# Patient Record
Sex: Female | Born: 1985 | Race: Black or African American | Hispanic: No | Marital: Single | Smoking: Former smoker
Health system: Southern US, Community
[De-identification: ages and names within clinical notes are randomized; demographics above are authoritative.]

## PROBLEM LIST (undated history)

## (undated) ENCOUNTER — Inpatient Hospital Stay (HOSPITAL_COMMUNITY): Payer: Self-pay

## (undated) DIAGNOSIS — F419 Anxiety disorder, unspecified: Secondary | ICD-10-CM

## (undated) DIAGNOSIS — N750 Cyst of Bartholin's gland: Secondary | ICD-10-CM

## (undated) HISTORY — PX: CHOLECYSTECTOMY: SHX55

## (undated) HISTORY — PX: BREAST SURGERY: SHX581

---

## 2015-10-12 ENCOUNTER — Encounter (HOSPITAL_COMMUNITY): Payer: Self-pay | Admitting: Emergency Medicine

## 2015-10-12 ENCOUNTER — Emergency Department (HOSPITAL_COMMUNITY)
Admission: EM | Admit: 2015-10-12 | Discharge: 2015-10-12 | Disposition: A | Discharge status: Home / Self Care | Payer: Medicaid Other | Attending: Emergency Medicine | Admitting: Emergency Medicine

## 2015-10-12 DIAGNOSIS — J029 Acute pharyngitis, unspecified: Secondary | ICD-10-CM | POA: Diagnosis not present

## 2015-10-12 DIAGNOSIS — N760 Acute vaginitis: Secondary | ICD-10-CM | POA: Diagnosis not present

## 2015-10-12 DIAGNOSIS — N72 Inflammatory disease of cervix uteri: Secondary | ICD-10-CM | POA: Diagnosis not present

## 2015-10-12 DIAGNOSIS — F17200 Nicotine dependence, unspecified, uncomplicated: Secondary | ICD-10-CM | POA: Insufficient documentation

## 2015-10-12 LAB — WET PREP, GENITAL
Sperm: NONE SEEN
Trich, Wet Prep: NONE SEEN
Yeast Wet Prep HPF POC: NONE SEEN

## 2015-10-12 LAB — RAPID STREP SCREEN (MED CTR MEBANE ONLY): STREPTOCOCCUS, GROUP A SCREEN (DIRECT): NEGATIVE

## 2015-10-12 MED ORDER — AZITHROMYCIN 250 MG PO TABS
1000.0000 mg | ORAL_TABLET | Freq: Once | ORAL | Status: AC
  Administered 2015-10-12: 1000 mg via ORAL
  Filled 2015-10-12: qty 4

## 2015-10-12 MED ORDER — CEFTRIAXONE SODIUM 250 MG IJ SOLR
250.0000 mg | Freq: Once | INTRAMUSCULAR | Status: AC
  Administered 2015-10-12: 250 mg via INTRAMUSCULAR
  Filled 2015-10-12: qty 250

## 2015-10-12 MED ORDER — OXYCODONE-ACETAMINOPHEN 5-325 MG PO TABS
1.0000 | ORAL_TABLET | Freq: Once | ORAL | Status: AC
  Administered 2015-10-12: 1 via ORAL
  Filled 2015-10-12: qty 1

## 2015-10-12 MED ORDER — METRONIDAZOLE 500 MG PO TABS: 500.0000 mg | ORAL_TABLET | Freq: Two times a day (BID) | ORAL | Status: DC

## 2015-10-12 MED ORDER — LIDOCAINE HCL 1 % IJ SOLN
INTRAMUSCULAR | Status: AC
  Administered 2015-10-12: 2.1 mL
  Filled 2015-10-12: qty 20

## 2015-10-12 NOTE — Progress Notes (Signed)
Medicaid [place] access response hx indicates [facility] [address] [place], [place] [id number] [phone]

## 2015-10-12 NOTE — ED Notes (Signed)
Pt reports body aches and sore throat since Saturday. Sore throat unrelieved by multisymptom cold reliever. Pt also reports she has been having creamy vaginal discharge that began this am. Pt recently had sex with boy friend.

## 2015-10-12 NOTE — ED Provider Notes (Signed)
CSN: [id number]     Arrival date & time 10/12/15  4098 History   First MD Initiated Contact with Patient 10/12/15 [phone]     Chief Complaint  Patient presents with  . Sore Throat  . Generalized Body Aches  . Vaginal Discharge     (Consider location/radiation/quality/duration/timing/severity/associated sxs/prior Treatment) HPI   Ann Allen is a 30 y.o. female who presents for evaluation of sore throat for 3 days, with fever, chills, achiness, nausea and decreased appetite. She also noticed a vaginal discharge, white, thick, today. She's been sexually active recently. No dysuria or change in bowel habits. There are no other no modifying factors.   History reviewed. No pertinent past medical history. History reviewed. No pertinent past surgical history. History reviewed. No pertinent family history. Social History  Substance Use Topics  . Smoking status: Current Every Day Smoker  . Smokeless tobacco: None  . Alcohol Use: Yes   OB History    No data available     Review of Systems  All other systems reviewed and are negative.     Allergies  Review of patient's allergies indicates no known allergies.  Home Medications   Prior to Admission medications   Medication Sig Start Date End Date Taking? Authorizing Provider  Pseudoephed-APAP-Guaifenesin (TYLENOL SINUS SEVERE CONGEST) 30-325-200 MG TABS Take 2 tablets by mouth every 6 (six) hours as needed (for cold symptoms).   Yes Historical Provider, MD   BP 114/75 mmHg  Pulse 111  Temp(Src) 99.3 F (37.4 C) (Oral)  Resp 16  SpO2 100% Physical Exam  Constitutional: She is oriented to person, place, and time. She appears well-developed and well-nourished.  HENT:  Head: Normocephalic and atraumatic.  Right Ear: External ear normal.  Left Ear: External ear normal.  Bilateral tonsillar hypertrophy with exudate. No peritonsillar swelling.  Eyes: Conjunctivae and EOM are normal. Pupils are equal, round, and reactive to light.   Neck: Normal range of motion and phonation normal. Neck supple.  Tender upper cervical nodes, bilaterally.  Cardiovascular: Normal rate, regular rhythm and normal heart sounds.   Pulmonary/Chest: Effort normal and breath sounds normal. She exhibits no bony tenderness.  Abdominal: Soft. There is no tenderness.  Genitourinary:  Normal external female genitalia. Small amount of yellow opaque vaginal discharge. No cervical discharge or cervicitis. On bimanual examination there is mild diffuse right-sided tenderness without palpable mass or organomegaly.  Musculoskeletal: Normal range of motion.  Neurological: She is alert and oriented to person, place, and time. No cranial nerve deficit or sensory deficit. She exhibits normal muscle tone. Coordination normal.  Skin: Skin is warm, dry and intact.  Psychiatric: She has a normal mood and affect. Her behavior is normal. Judgment and thought content normal.  Nursing note and vitals reviewed.   ED Course  Procedures (including critical care time)  Medications  oxyCODONE-acetaminophen (PERCOCET/ROXICET) 5-325 MG per tablet 1 tablet (1 tablet Oral Given 10/12/15 1204)  cefTRIAXone (ROCEPHIN) injection 250 mg (250 mg Intramuscular Given 10/12/15 1204)  azithromycin (ZITHROMAX) tablet 1,000 mg (1,000 mg Oral Given 10/12/15 1203)  lidocaine (XYLOCAINE) 1 % (with pres) injection (2.1 mLs  Given 10/12/15 1204)    Patient Vitals for the past 24 hrs:  BP Temp Temp src Pulse Resp SpO2  10/12/15 1339 112/76 mmHg - - 99 18 100 %  10/12/15 1134 108/66 mmHg 99.6 F (37.6 C) Oral 112 18 100 %  10/12/15 0955 114/75 mmHg 99.3 F (37.4 C) Oral 111 16 100 %    At discharge- Reevaluation  with update and discussion. After initial assessment and treatment, an updated evaluation reveals comfortable ambulating easily. Findings discussed with patient, all questions answered. Lemond Griffee L    Labs Review Labs Reviewed  RAPID STREP SCREEN (NOT AT [facility])  CULTURE,  GROUP A STREP ([facility])  WET PREP, GENITAL  RPR  HIV ANTIBODY (ROUTINE TESTING)  GC/CHLAMYDIA PROBE AMP ([place]) NOT AT [facility]    Imaging Review No results found. I have personally reviewed and evaluated these images and lab results as part of my medical decision-making.   EKG Interpretation None      MDM   Final diagnoses:  Cervicitis  Pharyngitis  Vaginitis    Combination of pharyngitis, strep negative, and vaginitis, sexually active patient. Mild right adnexal tenderness, without mass. Clinical concern for gonococcal infection. Treated empirically. Vaginitis will be covered with Flagyl.  Nursing Notes Reviewed/ Care Coordinated Applicable Imaging Reviewed Interpretation of Laboratory Data incorporated into ED treatment  The patient appears reasonably screened and/or stabilized for discharge and I doubt any other medical condition or other [facility] requiring further screening, evaluation, or treatment in the ED at this time prior to discharge.  Plan: Home Medications- Flagyl; Home Treatments- rest; return here if the recommended treatment, does not improve the symptoms; Recommended follow up- PCP 1 week     Mancel BaleElliott Dicky Boer, MD 10/12/15 1413

## 2015-10-12 NOTE — ED Notes (Signed)
Bed: WA01 Expected date:  Expected time:  Means of arrival:  Comments: EMS- 20s, sore throat/body aches

## 2015-10-12 NOTE — Congregational Nurse Program (Signed)
Congregational Nurse Program Note  Date of Encounter: 10/12/2015  Past Medical History: No past medical history on file.  Encounter Details:     CNP Questionnaire - 10/12/15 1905    Patient Demographics   Is this a new or existing patient? New   Patient is considered a/an Not Applicable   Race African-American/Black   Patient Assistance   Location of Patient Assistance Not Applicable   Patient's financial/insurance status Medicaid   Uninsured Patient No   Patient referred to apply for the following financial assistance Not Applicable   Food insecurities addressed Not Applicable   Transportation assistance No   Assistance securing medications No   Educational health offerings Acute disease   Encounter Details   Primary purpose of visit Acute Illness/Condition Visit;Post ED/Hospitalization Visit;Spiritual Care/Support Visit;Education/Health Concerns   Was an Emergency Department visit averted? Not Applicable   Does patient have a medical provider? Yes   Patient referred to Not Applicable   Was a mental health screening completed? (GAINS tool) No   Does patient have dental issues? No   Does patient have vision issues? No   Does your patient have an abnormal blood pressure today? No   Since previous encounter, have you referred patient for abnormal blood pressure that resulted in a new diagnosis or medication change? No   Does your patient have an abnormal blood glucose today? No   Since previous encounter, have you referred patient for abnormal blood glucose that resulted in a new diagnosis or medication change? No   Was there a life-saving intervention made? No    Client in to see nurse past her  ED  Visit . Crying  States her  Throat is really sore ,aching  ,has a fever ,hasnt eaten in 2-3 days . Nurse  Counseled regarding sore throat , given a list of things to do  Like gargle with  Warm water and salt , ,throat lozenges , drink  Plenty of  Fluids , suck on ice pops . Client  states she had  not  eaten  Lately  And encourage to drink plenty off fluids , rest and give herself  Past  ED treatment 24 hours to feel better if not  Come by to see the nurse tomorrow. States culture for strep was negative.

## 2015-10-12 NOTE — Discharge Instructions (Signed)
Sexually Transmitted Disease °A sexually transmitted disease (STD) is a disease or infection that may be passed (transmitted) from person to person, usually during sexual activity. This may happen by way of saliva, semen, blood, vaginal mucus, or urine. Common STDs include: °· Gonorrhea. °· Chlamydia. °· Syphilis. °· HIV and AIDS. °· Genital herpes. °· Hepatitis B and C. °· Trichomonas. °· Human papillomavirus (HPV). °· Pubic lice. °· Scabies. °· Mites. °· Bacterial vaginosis. °WHAT ARE CAUSES OF STDs? °An STD may be caused by bacteria, a virus, or parasites. STDs are often transmitted during sexual activity if one person is infected. However, they may also be transmitted through nonsexual means. STDs may be transmitted after:  °· Sexual intercourse with an infected person. °· Sharing sex toys with an infected person. °· Sharing needles with an infected person or using unclean piercing or tattoo needles. °· Having intimate contact with the genitals, mouth, or rectal areas of an infected person. °· Exposure to infected fluids during birth. °WHAT ARE THE SIGNS AND SYMPTOMS OF STDs? °Different STDs have different symptoms. Some people may not have any symptoms. If symptoms are present, they may include: °· Painful or bloody urination. °· Pain in the pelvis, abdomen, vagina, anus, throat, or eyes. °· A skin rash, itching, or irritation. °· Growths, ulcerations, blisters, or sores in the genital and anal areas. °· Abnormal vaginal discharge with or without bad odor. °· Penile discharge in men. °· Fever. °· Pain or bleeding during sexual intercourse. °· Swollen glands in the groin area. °· Yellow skin and eyes (jaundice). This is seen with hepatitis. °· Swollen testicles. °· Infertility. °· Sores and blisters in the mouth. °HOW ARE STDs DIAGNOSED? °To make a diagnosis, your health care provider may: °· Take a medical history. °· Perform a physical exam. °· Take a sample of any discharge to examine. °· Swab the throat,  cervix, opening to the penis, rectum, or vagina for testing. °· Test a sample of your first morning urine. °· Perform blood tests. °· Perform a Pap test, if this applies. °· Perform a colposcopy. °· Perform a laparoscopy. °HOW ARE STDs TREATED? °Treatment depends on the STD. Some STDs may be treated but not cured. °· Chlamydia, gonorrhea, trichomonas, and syphilis can be cured with antibiotic medicine. °· Genital herpes, hepatitis, and HIV can be treated, but not cured, with prescribed medicines. The medicines lessen symptoms. °· Genital warts from HPV can be treated with medicine or by freezing, burning (electrocautery), or surgery. Warts may come back. °· HPV cannot be cured with medicine or surgery. However, abnormal areas may be removed from the cervix, vagina, or vulva. °· If your diagnosis is confirmed, your recent sexual partners need treatment. This is true even if they are symptom-free or have a negative culture or evaluation. They should not have sex until their health care providers say it is okay. °· Your health care provider may test you for infection again 3 months after treatment. °HOW CAN I REDUCE MY RISK OF GETTING AN STD? °Take these steps to reduce your risk of getting an STD: °· Use latex condoms, dental dams, and water-soluble lubricants during sexual activity. Do not use petroleum jelly or oils. °· Avoid having multiple sex partners. °· Do not have sex with someone who has other sex partners °· Do not have sex with anyone you do not know or who is at high risk for an STD. °· Avoid risky sex practices that can break your skin. °· Do not have sex   if you have open sores on your mouth or skin.  Avoid drinking too much alcohol or taking illegal drugs. Alcohol and drugs can affect your judgment and put you in a vulnerable position.  Avoid engaging in oral and anal sex acts.  Get vaccinated for HPV and hepatitis. If you have not received these vaccines in the past, talk to your health care  provider about whether one or both might be right for you.  If you are at risk of being infected with HIV, it is recommended that you take a prescription medicine daily to prevent HIV infection. This is called pre-exposure prophylaxis (PrEP). You are considered at risk if:  You are a man who has sex with other men (MSM).  You are a heterosexual man or woman and are sexually active with more than one partner.  You take drugs by injection.  You are sexually active with a partner who has HIV.  Talk with your health care provider about whether you are at high risk of being infected with HIV. If you choose to begin PrEP, you should first be tested for HIV. You should then be tested every 3 months for as long as you are taking PrEP. WHAT SHOULD I DO IF I THINK I HAVE AN STD?  See your health care provider.  Tell your sexual partner(s). They should be tested and treated for any STDs.  Do not have sex until your health care provider says it is okay. WHEN SHOULD I GET IMMEDIATE MEDICAL CARE? Contact your health care provider right away if:   You have severe abdominal pain.  You are a man and notice swelling or pain in your testicles.  You are a woman and notice swelling or pain in your vagina.   This information is not intended to replace advice given to you by your health care provider. Make sure you discuss any questions you have with your health care provider.   Document Released: 07/22/2002 Document Revised: 05/22/2014 Document Reviewed: 11/19/2012 Elsevier Interactive Patient Education 2016 Else[employer] Inc[employer]  Pharyngitis Pharyngitis is a sore throat (pharynx). There is redness, pain, and swelling of your throat. HOME CARE   Drink enough fluids to keep your pee (urine) clear or pale yellow.  Only take medicine as told by your doctor.  You may get sick again if you do not take medicine as told. Finish your medicines, even if you start to feel better.  Do not take  aspirin.  Rest.  Rinse your mouth (gargle) with salt water ( tsp of salt per 1 qt of water) every 1-2 hours. This will help the pain.  If you are not at risk for choking, you can suck on hard candy or sore throat lozenges. GET HELP IF:  You have large, tender lumps on your neck.  You have a rash.  You cough up green, yellow-[place]le, or bloody spit. GET HELP RIGHT AWAY IF:   You have a stiff neck.  You drool or cannot swallow liquids.  You throw up (vomit) or are not able to keep medicine or liquids down.  You have very bad pain that does not go away with medicine.  You have problems breathing (not from a stuffy nose). MAKE SURE YOU:   Understand these instructions.  Will watch your condition.  Will get help right away if you are not doing well or get worse.   This information is not intended to replace advice given to you by your health care provider. Make sure you  discuss any questions you have with your health care provider.   Document Released: 10/18/2007 Document Revised: 02/19/2013 Document Reviewed: 01/06/2013 Elsevier Interactive Patient Education 2016 Elsevier Inc.  Bacterial Vaginosis Bacterial vaginosis is a vaginal infection that occurs when the normal balance of bacteria in the vagina is disrupted. It results from an overgrowth of certain bacteria. This is the most common vaginal infection in women of childbearing age. Treatment is important to prevent complications, especially in pregnant women, as it can cause a premature delivery. CAUSES  Bacterial vaginosis is caused by an increase in harmful bacteria that are normally present in smaller amounts in the vagina. Several different kinds of bacteria can cause bacterial vaginosis. However, the reason that the condition develops is not fully understood. RISK FACTORS Certain activities or behaviors can put you at an increased risk of developing bacterial vaginosis, including:  Having a new sex partner or multiple  sex partners.  Douching.  Using an intrauterine device (IUD) for contraception. Women do not get bacterial vaginosis from toilet seats, bedding, swimming pools, or contact with objects around them. SIGNS AND SYMPTOMS  Some women with bacterial vaginosis have no signs or symptoms. Common symptoms include:  Grey vaginal discharge.  A fishlike odor with discharge, especially after sexual intercourse.  Itching or burning of the vagina and vulva.  Burning or pain with urination. DIAGNOSIS  Your health care provider will take a medical history and examine the vagina for signs of bacterial vaginosis. A sample of vaginal fluid may be taken. Your health care provider will look at this sample under a microscope to check for bacteria and abnormal cells. A vaginal pH test may also be done.  TREATMENT  Bacterial vaginosis may be treated with antibiotic medicines. These may be given in the form of a pill or a vaginal cream. A second round of antibiotics may be prescribed if the condition comes back after treatment. Because bacterial vaginosis increases your risk for sexually transmitted diseases, getting treated can help reduce your risk for chlamydia, gonorrhea, HIV, and herpes. HOME CARE INSTRUCTIONS   Only take over-the-counter or prescription medicines as directed by your health care provider.  If antibiotic medicine was prescribed, take it as directed. Make sure you finish it even if you start to feel better.  Tell all sexual partners that you have a vaginal infection. They should see their health care provider and be treated if they have problems, such as a mild rash or itching.  During treatment, it is important that you follow these instructions:  Avoid sexual activity or use condoms correctly.  Do not douche.  Avoid alcohol as directed by your health care provider.  Avoid breastfeeding as directed by your health care provider. SEEK MEDICAL CARE IF:   Your symptoms are not improving  after 3 days of treatment.  You have increased discharge or pain.  You have a fever. MAKE SURE YOU:   Understand these instructions.  Will watch your condition.  Will get help right away if you are not doing well or get worse. FOR MORE INFORMATION  Centers for Disease Control and Prevention, Division of STD Prevention: [employer] American Sexual Health Association (ASHA): www.ashastd.org    This information is not intended to replace advice given to you by your health care provider. Make sure you discuss any questions you have with your health care provider.   Document Released: 05/01/2005 Document Revised: 05/22/2014 Document Reviewed: 12/11/2012 Elsevier Interactive Patient Education [employer]

## 2015-10-13 ENCOUNTER — Emergency Department (HOSPITAL_COMMUNITY)
Admission: EM | Admit: 2015-10-13 | Discharge: 2015-10-13 | Disposition: A | Discharge status: Home / Self Care | Payer: Medicaid Other | Attending: Emergency Medicine | Admitting: Emergency Medicine

## 2015-10-13 ENCOUNTER — Encounter (HOSPITAL_COMMUNITY): Payer: Self-pay | Admitting: Emergency Medicine

## 2015-10-13 DIAGNOSIS — J028 Acute pharyngitis due to other specified organisms: Secondary | ICD-10-CM

## 2015-10-13 DIAGNOSIS — F17200 Nicotine dependence, unspecified, uncomplicated: Secondary | ICD-10-CM | POA: Diagnosis not present

## 2015-10-13 DIAGNOSIS — J029 Acute pharyngitis, unspecified: Secondary | ICD-10-CM | POA: Diagnosis present

## 2015-10-13 DIAGNOSIS — J069 Acute upper respiratory infection, unspecified: Secondary | ICD-10-CM | POA: Insufficient documentation

## 2015-10-13 DIAGNOSIS — Z79899 Other long term (current) drug therapy: Secondary | ICD-10-CM | POA: Diagnosis not present

## 2015-10-13 DIAGNOSIS — B9789 Other viral agents as the cause of diseases classified elsewhere: Secondary | ICD-10-CM

## 2015-10-13 DIAGNOSIS — R509 Fever, unspecified: Secondary | ICD-10-CM

## 2015-10-13 LAB — GC/CHLAMYDIA PROBE AMP (~~LOC~~) NOT AT ARMC
CHLAMYDIA, DNA PROBE: NEGATIVE
Neisseria Gonorrhea: NEGATIVE

## 2015-10-13 LAB — HIV ANTIBODY (ROUTINE TESTING W REFLEX): HIV Screen 4th Generation wRfx: NONREACTIVE

## 2015-10-13 LAB — RPR: RPR Ser Ql: NONREACTIVE

## 2015-10-13 MED ORDER — CETIRIZINE HCL 10 MG PO TABS: 10.0000 mg | ORAL_TABLET | Freq: Every day | ORAL | Status: DC

## 2015-10-13 MED ORDER — ONDANSETRON HCL 4 MG/2ML IJ SOLN
4.0000 mg | INTRAMUSCULAR | Status: AC
  Administered 2015-10-13: 4 mg via INTRAVENOUS
  Filled 2015-10-13: qty 2

## 2015-10-13 MED ORDER — NAPROXEN 500 MG PO TABS: 500.0000 mg | ORAL_TABLET | Freq: Two times a day (BID) | ORAL | Status: DC

## 2015-10-13 MED ORDER — ONDANSETRON 4 MG PO TBDP: 4.0000 mg | ORAL_TABLET | Freq: Three times a day (TID) | ORAL | Status: DC | PRN

## 2015-10-13 MED ORDER — FLUTICASONE PROPIONATE 50 MCG/ACT NA SUSP
2.0000 | Freq: Every day | NASAL | Status: DC
Start: 2015-10-13 — End: 2016-07-05

## 2015-10-13 MED ORDER — ACETAMINOPHEN 325 MG PO TABS
650.0000 mg | ORAL_TABLET | Freq: Once | ORAL | Status: AC
  Administered 2015-10-13: 650 mg via ORAL
  Filled 2015-10-13: qty 2

## 2015-10-13 MED ORDER — DEXAMETHASONE SODIUM PHOSPHATE 10 MG/ML IJ SOLN
10.0000 mg | Freq: Once | INTRAMUSCULAR | Status: AC
  Administered 2015-10-13: 10 mg via INTRAMUSCULAR
  Filled 2015-10-13: qty 1

## 2015-10-13 MED ORDER — SODIUM CHLORIDE 0.9 % IV BOLUS (SEPSIS)
1000.0000 mL | Freq: Once | INTRAVENOUS | Status: AC
  Administered 2015-10-13: 1000 mL via INTRAVENOUS

## 2015-10-13 NOTE — ED Notes (Signed)
Patient presents with multiple complaints. C/o generalized body aches, subjective fever, nausea, sore throat. Seen yesterday for same, diagnosed with pharyngitis and treated for STD. Patient states no relief of symptoms and has not been able to take PO antibiotics.

## 2015-10-13 NOTE — ED Notes (Signed)
Patient was alert, oriented and stable upon discharge. RN went over AVS and patient had no further questions.  

## 2015-10-13 NOTE — Discharge Instructions (Signed)
Pharyngitis °Pharyngitis is redness, pain, and swelling (inflammation) of your pharynx.  °CAUSES  °Pharyngitis is usually caused by infection. Most of the time, these infections are from viruses (viral) and are part of a cold. However, sometimes pharyngitis is caused by bacteria (bacterial). Pharyngitis can also be caused by allergies. Viral pharyngitis may be spread from person to person by coughing, sneezing, and personal items or utensils (cups, forks, spoons, toothbrushes). Bacterial pharyngitis may be spread from person to person by more intimate contact, such as kissing.  °SIGNS AND SYMPTOMS  °Symptoms of pharyngitis include:   °· Sore throat.   °· Tiredness (fatigue).   °· Low-grade fever.   °· Headache. °· Joint pain and muscle aches. °· Skin rashes. °· Swollen lymph nodes. °· Plaque-like film on throat or tonsils (often seen with bacterial pharyngitis). °DIAGNOSIS  °Your health care provider will ask you questions about your illness and your symptoms. Your medical history, along with a physical exam, is often all that is needed to diagnose pharyngitis. Sometimes, a rapid strep test is done. Other lab tests may also be done, depending on the suspected cause.  °TREATMENT  °Viral pharyngitis will usually get better in 3-4 days without the use of medicine. Bacterial pharyngitis is treated with medicines that kill germs (antibiotics).  °HOME CARE INSTRUCTIONS  °· Drink enough water and fluids to keep your urine clear or pale yellow.   °· Only take over-the-counter or prescription medicines as directed by your health care provider:   °· If you are prescribed antibiotics, make sure you finish them even if you start to feel better.   °· Do not take aspirin.   °· Get lots of rest.   °· Gargle with 8 oz of salt water (½ tsp of salt per 1 qt of water) as often as every 1-2 hours to soothe your throat.   °· Throat lozenges (if you are not at risk for choking) or sprays may be used to soothe your throat. °SEEK MEDICAL  CARE IF:  °· You have large, tender lumps in your neck. °· You have a rash. °· You cough up green, yellow-[place], or bloody spit. °SEEK IMMEDIATE MEDICAL CARE IF:  °· Your neck becomes stiff. °· You drool or are unable to swallow liquids. °· You vomit or are unable to keep medicines or liquids down. °· You have severe pain that does not go away with the use of recommended medicines. °· You have trouble breathing (not caused by a stuffy nose). °MAKE SURE YOU:  °· Understand these instructions. °· Will watch your condition. °· Will get help right away if you are not doing well or get worse. °  °This information is not intended to replace advice given to you by your health care provider. Make sure you discuss any questions you have with your health care provider. °  °Document Released: 05/01/2005 Document Revised: 02/19/2013 Document Reviewed: 01/06/2013 °Elsevier Interactive Patient Education ©2016 [employer] °Upper Respiratory Infection, Adult °Most upper respiratory infections (URIs) are a viral infection of the air passages leading to the lungs. A URI affects the nose, throat, and upper air passages. The most common type of URI is nasopharyngitis and is typically referred to as "the common cold." °URIs run their course and usually go away on their own. Most of the time, a URI does not require medical attention, but sometimes a bacterial infection in the upper airways can follow a viral infection. This is called a secondary infection. Sinus and middle ear infections are common types of secondary upper respiratory infections. °Bacterial pneumonia   can also complicate a URI. A URI can worsen asthma and chronic obstructive pulmonary disease (COPD). Sometimes, these complications can require emergency medical care and may be life threatening.  °CAUSES °Almost all URIs are caused by viruses. A virus is a type of germ and can spread from one person to another.  °RISKS FACTORS °You may be at risk for a URI if:  °· You  smoke.   °· You have chronic heart or lung disease. °· You have a weakened defense (immune) system.   °· You are very young or very old.   °· You have nasal allergies or asthma. °· You work in crowded or poorly ventilated areas. °· You work in health care facilities or schools. °SIGNS AND SYMPTOMS  °Symptoms typically develop 2-3 days after you come in contact with a cold virus. Most viral URIs last 7-10 days. However, viral URIs from the influenza virus (flu virus) can last 14-18 days and are typically more severe. Symptoms may include:  °· Runny or stuffy (congested) nose.   °· Sneezing.   °· Cough.   °· Sore throat.   °· Headache.   °· Fatigue.   °· Fever.   °· Loss of appetite.   °· Pain in your forehead, behind your eyes, and over your cheekbones (sinus pain). °· Muscle aches.   °DIAGNOSIS  °Your health care provider may diagnose a URI by: °· Physical exam. °· Tests to check that your symptoms are not due to another condition such as: °¨ Strep throat. °¨ Sinusitis. °¨ Pneumonia. °¨ Asthma. °TREATMENT  °A URI goes away on its own with time. It cannot be cured with medicines, but medicines may be prescribed or recommended to relieve symptoms. Medicines may help: °· Reduce your fever. °· Reduce your cough. °· Relieve nasal congestion. °HOME CARE INSTRUCTIONS  °· Take medicines only as directed by your health care provider.   °· Gargle warm saltwater or take cough drops to comfort your throat as directed by your health care provider. °· Use a warm mist humidifier or inhale steam from a shower to increase air moisture. This may make it easier to breathe. °· Drink enough fluid to keep your urine clear or pale yellow.   °· Eat soups and other clear broths and maintain good nutrition.   °· Rest as needed.   °· Return to work when your temperature has returned to normal or as your health care provider advises. You may need to stay home longer to avoid infecting others. You can also use a face mask and careful hand  washing to prevent spread of the virus. °· Increase the usage of your inhaler if you have asthma.   °· Do not use any tobacco products, including cigarettes, chewing tobacco, or electronic cigarettes. If you need help quitting, ask your health care provider. °PREVENTION  °The best way to protect yourself from getting a cold is to practice good hygiene.  °· Avoid oral or hand contact with people with cold symptoms.   °· Wash your hands often if contact occurs.   °There is no clear evidence that vitamin C, vitamin E, echinacea, or exercise reduces the chance of developing a cold. However, it is always recommended to get plenty of rest, exercise, and practice good nutrition.  °SEEK MEDICAL CARE IF:  °· You are getting worse rather than better.   °· Your symptoms are not controlled by medicine.   °· You have chills. °· You have worsening shortness of breath. °· You have [place] or red mucus. °· You have yellow or [place] nasal discharge. °· You have pain in your face, especially   when you bend forward. °· You have a fever. °· You have swollen neck glands. °· You have pain while swallowing. °· You have white areas in the back of your throat. °SEEK IMMEDIATE MEDICAL CARE IF:  °· You have severe or persistent: °¨ Headache. °¨ Ear pain. °¨ Sinus pain. °¨ Chest pain. °· You have chronic lung disease and any of the following: °¨ Wheezing. °¨ Prolonged cough. °¨ Coughing up blood. °¨ A change in your usual mucus. °· You have a stiff neck. °· You have changes in your: °¨ Vision. °¨ Hearing. °¨ Thinking. °¨ Mood. °MAKE SURE YOU:  °· Understand these instructions. °· Will watch your condition. °· Will get help right away if you are not doing well or get worse. °  °This information is not intended to replace advice given to you by your health care provider. Make sure you discuss any questions you have with your health care provider. °  °Document Released: 10/25/2000 Document Revised: 09/15/2014 Document Reviewed: 08/06/2013 °Elsevier  Interactive Patient Education ©2016 Else[employer] Inc. ° °

## 2015-10-13 NOTE — ED Provider Notes (Signed)
CSN: [id number]     Arrival date & time 10/13/15  1533 History   First MD Initiated Contact with Patient 10/13/15 1629     Chief Complaint  Patient presents with  . Generalized Body Aches  . Nausea  . Sore Throat    Ann Allen is a 30 y.o. female who presents to the ED complaining of continued sore throat with body aches, subjective fever, and nasal congestion. She reports she is not feeling better after being seen in the emergency department yesterday. She reports she has not been able to take her antibiotics because her throat feels like knives. Patient was seen yesterday in the emergency department and had a negative rapid strep test. She was treated for gonorrhea, chlamydia and bacterial vaginosis with Flagyl. Patient reports she has not been wanting to drink liquids because her throat hurts. She has taken nothing for treatment of her symptoms today. She denies cough, shortness of breath, abdominal pain, vomiting, diarrhea, rashes.   Patient is a 30 y.o. female presenting with pharyngitis. The history is provided by the patient. No language interpreter was used.  Sore Throat Associated symptoms include arthralgias, chills, congestion, a fever (subjective ), nausea and a sore throat. Pertinent negatives include no abdominal pain, chest pain, coughing, headaches, neck pain, rash or vomiting.    History reviewed. No pertinent past medical history. History reviewed. No pertinent past surgical history. No family history on file. Social History  Substance Use Topics  . Smoking status: Current Every Day Smoker  . Smokeless tobacco: None  . Alcohol Use: Yes   OB History    No data available     Review of Systems  Constitutional: Positive for fever (subjective ) and chills.  HENT: Positive for congestion, ear pain, postnasal drip, rhinorrhea, sinus pressure, sneezing and sore throat. Negative for drooling, ear discharge, facial swelling, mouth sores, trouble swallowing and voice change.    Eyes: Negative for visual disturbance.  Respiratory: Negative for cough, shortness of breath and wheezing.   Cardiovascular: Negative for chest pain.  Gastrointestinal: Positive for nausea. Negative for vomiting, abdominal pain and diarrhea.  Genitourinary: Negative for dysuria and difficulty urinating.  Musculoskeletal: Positive for arthralgias. Negative for back pain and neck pain.  Skin: Negative for rash.  Neurological: Negative for dizziness, syncope, light-headedness and headaches.      Allergies  Review of patient's allergies indicates no known allergies.  Home Medications   Prior to Admission medications   Medication Sig Start Date End Date Taking? Authorizing Provider  metroNIDAZOLE (FLAGYL) 500 MG tablet Take 1 tablet (500 mg total) by mouth 2 (two) times daily. One po bid x 7 days 10/12/15  Yes Mancel BaleElliott Wentz, MD  Pseudoephed-APAP-Guaifenesin (TYLENOL SINUS SEVERE CONGEST) 30-325-200 MG TABS Take 2 tablets by mouth every 6 (six) hours as needed (for cold symptoms).   Yes Historical Provider, MD  cetirizine (ZYRTEC ALLERGY) 10 MG tablet Take 1 tablet (10 mg total) by mouth daily. 10/13/15   Everlene FarrierWilliam Islam Villescas, PA-C  fluticasone (FLONASE) 50 MCG/ACT nasal spray Place 2 sprays into both nostrils daily. 10/13/15   Everlene FarrierWilliam Maryana Pittmon, PA-C  naproxen (NAPROSYN) 500 MG tablet Take 1 tablet (500 mg total) by mouth 2 (two) times daily with a meal. 10/13/15   Everlene FarrierWilliam Treshun Wold, PA-C  ondansetron (ZOFRAN ODT) 4 MG disintegrating tablet Take 1 tablet (4 mg total) by mouth every 8 (eight) hours as needed for nausea or vomiting. 10/13/15   Everlene FarrierWilliam Alura Olveda, PA-C   BP 108/72 mmHg  Pulse 103  Temp(Src) 98.6 F (37 C) (Oral)  Resp 18  SpO2 99%  LMP  (Approximate) Physical Exam  Constitutional: She appears well-developed and well-nourished. No distress.  Nontoxic appearing.  HENT:  Head: Normocephalic and atraumatic.  Right Ear: External ear normal.  Left Ear: External ear normal.  Mouth/Throat:  Oropharyngeal exudate present.  Moderate bilateral tonsillar hypertrophy with exudates. Uvula is midline without edema. No peritonsillar abscess. No trismus. No drooling. Tongue protrusion is normal. Speech is clear and coherent. Mild middle ear effusion noted bilaterally. Boggy nasal turbinates noted bilaterally.  Eyes: Conjunctivae are normal. Pupils are equal, round, and reactive to light. Right eye exhibits no discharge. Left eye exhibits no discharge.  Neck: Normal range of motion. Neck supple. No JVD present.  Cardiovascular: Regular rhythm, normal heart sounds and intact distal pulses.  Exam reveals no gallop and no friction rub.   No murmur heard. Heart rate is 108.  Pulmonary/Chest: Effort normal and breath sounds normal. No stridor. No respiratory distress. She has no wheezes. She has no rales.  Lungs are clear to auscultation bilaterally.  Abdominal: Soft. She exhibits no distension. There is no tenderness. There is no guarding.  Musculoskeletal: She exhibits no edema or tenderness.  Lymphadenopathy:    She has no cervical adenopathy.  Neurological: She is alert. Coordination normal.  Skin: Skin is warm and dry. No rash noted. She is not diaphoretic. No erythema. No pallor.  Psychiatric: She has a normal mood and affect. Her behavior is normal.  Nursing note and vitals reviewed.   ED Course  Procedures (including critical care time) Labs Review Labs Reviewed - No data to display  Imaging Review No results found. I have personally reviewed and evaluated these images and lab results as part of my medical decision-making.   EKG Interpretation None      Filed Vitals:   10/13/15 1554 10/13/15 1728 10/13/15 1800 10/13/15 1802  BP: 110/75 119/69 108/72   Pulse: 128 99 112 103  Temp: 98.6 F (37 C)     TempSrc: Oral     Resp: SpO2: 99% 98% 100% 99%     MDM   Meds given in ED:  Medications  ondansetron (ZOFRAN) injection 4 mg (not administered)   dexamethasone (DECADRON) injection 10 mg (10 mg Intramuscular Given 10/13/15 1726)  sodium chloride 0.9 % bolus 1,000 mL (0 mLs Intravenous Stopped 10/13/15 1832)  acetaminophen (TYLENOL) tablet 650 mg (650 mg Oral Given 10/13/15 1725)    New Prescriptions   CETIRIZINE (ZYRTEC ALLERGY) 10 MG TABLET    Take 1 tablet (10 mg total) by mouth daily.   FLUTICASONE (FLONASE) 50 MCG/ACT NASAL SPRAY    Place 2 sprays into both nostrils daily.   NAPROXEN (NAPROSYN) 500 MG TABLET    Take 1 tablet (500 mg total) by mouth 2 (two) times daily with a meal.   ONDANSETRON (ZOFRAN ODT) 4 MG DISINTEGRATING TABLET    Take 1 tablet (4 mg total) by mouth every 8 (eight) hours as needed for nausea or vomiting.    Final diagnoses:  URI (upper respiratory infection)  Sore throat (viral)   This is a 30 y.o. female who presents to the ED complaining of continued sore throat with body aches, subjective fever, and nasal congestion. She reports she is not feeling better after being seen in the emergency department yesterday. She reports she has not been able to take her antibiotics because her throat feels like knives. Patient was seen yesterday in  the emergency department and had a negative rapid strep test. She was treated for gonorrhea, chlamydia and bacterial vaginosis with Flagyl. Patient reports she has not been wanting to drink liquids because her throat hurts.  On exam the patient is afebrile nontoxic appearing. She is mentally tachycardic with a heart rate of 108. She does have moderate bilateral tonsillar hypertrophy with exudates. No peritonsillar abscess. No trismus. No drooling. Speech is clear and coherent.  We will provide the patient with Decadron, Tylenol and fluid bolus. At reevaluation patient has tolerated 4 cups of apple juice. She is feeling much better after medications provided in the emergency department. She feels ready for discharge.  HR is down to 92. She does not feel lightheaded or dizzy. Will  discharge with prescriptions for Zyrtec, Flonase, naproxen and Zofran. I encouraged her to push oral fluids and I discussed strict and specific return precautions. I advised the patient to follow-up with their primary care provider this week. I advised the patient to return to the emergency department with new or worsening symptoms or new concerns. The patient verbalized understanding and agreement with plan.       Everlene Farrier, PA-C 10/13/15 1836  Lorre Nick, MD 10/15/15 [phone]

## 2015-10-14 LAB — CULTURE, GROUP A STREP (THRC)

## 2015-10-19 NOTE — Congregational Nurse Program (Signed)
Congregational Nurse Program Note  Date of Encounter: 10/13/2015  Past Medical History: No past medical history on file.  Encounter Details:     CNP Questionnaire - 10/19/15 1847    Patient Demographics   Is this a new or existing patient? Existing   Patient is considered a/an Not Applicable   Race African-American/Black   Patient Assistance   Location of Patient Assistance Not Applicable   Patient's financial/insurance status Medicaid   Uninsured Patient No   Patient referred to apply for the following financial assistance Not Applicable   Food insecurities addressed Not Applicable   Transportation assistance No   Assistance securing medications No   Educational health offerings Acute disease;Medications   Encounter Details   Primary purpose of visit Acute Illness/Condition Visit;Spiritual Care/Support Visit   Was an Emergency Department visit averted? No   Does patient have a medical provider? Yes   Patient referred to Follow up with established PCP   Was a mental health screening completed? (GAINS tool) No   Does patient have dental issues? No   Does patient have vision issues? No   Does your patient have an abnormal blood pressure today? No   Since previous encounter, have you referred patient for abnormal blood pressure that resulted in a new diagnosis or medication change? No   Does your patient have an abnormal blood glucose today? No   Since previous encounter, have you referred patient for abnormal blood glucose that resulted in a new diagnosis or medication change? No   Was there a life-saving intervention made? No    Client informed  Nurse she did not feel any better today so she went back to  ED  And was given fluids and feels a lot better.

## 2015-12-24 ENCOUNTER — Emergency Department (HOSPITAL_COMMUNITY): Payer: Medicaid Other

## 2015-12-24 ENCOUNTER — Encounter (HOSPITAL_COMMUNITY): Payer: Self-pay | Admitting: Emergency Medicine

## 2015-12-24 ENCOUNTER — Emergency Department (HOSPITAL_COMMUNITY)
Admission: EM | Admit: 2015-12-24 | Discharge: 2015-12-24 | Disposition: A | Discharge status: Home / Self Care | Payer: Medicaid Other | Attending: Emergency Medicine | Admitting: Emergency Medicine

## 2015-12-24 DIAGNOSIS — Z79899 Other long term (current) drug therapy: Secondary | ICD-10-CM | POA: Diagnosis not present

## 2015-12-24 DIAGNOSIS — J069 Acute upper respiratory infection, unspecified: Secondary | ICD-10-CM | POA: Diagnosis not present

## 2015-12-24 DIAGNOSIS — F17200 Nicotine dependence, unspecified, uncomplicated: Secondary | ICD-10-CM | POA: Insufficient documentation

## 2015-12-24 DIAGNOSIS — R071 Chest pain on breathing: Secondary | ICD-10-CM | POA: Diagnosis present

## 2015-12-24 MED ORDER — PREDNISONE 20 MG PO TABS: 40.0000 mg | ORAL_TABLET | Freq: Every day | ORAL | 0 refills | Status: DC

## 2015-12-24 MED ORDER — GUAIFENESIN ER 600 MG PO TB12: 600.0000 mg | ORAL_TABLET | Freq: Two times a day (BID) | ORAL | 0 refills | Status: DC

## 2015-12-24 MED ORDER — IPRATROPIUM-ALBUTEROL 0.5-2.5 (3) MG/3ML IN SOLN
3.0000 mL | Freq: Once | RESPIRATORY_TRACT | Status: AC
  Administered 2015-12-24: 3 mL via RESPIRATORY_TRACT
  Filled 2015-12-24: qty 3

## 2015-12-24 MED ORDER — BENZONATATE 100 MG PO CAPS: 100.0000 mg | ORAL_CAPSULE | Freq: Three times a day (TID) | ORAL | 0 refills | Status: DC

## 2015-12-24 MED ORDER — IBUPROFEN 400 MG PO TABS
800.0000 mg | ORAL_TABLET | Freq: Once | ORAL | Status: AC
  Administered 2015-12-24: 800 mg via ORAL
  Filled 2015-12-24: qty 2

## 2015-12-24 MED ORDER — ALBUTEROL SULFATE HFA 108 (90 BASE) MCG/ACT IN AERS: 2.0000 | INHALATION_SPRAY | RESPIRATORY_TRACT | 0 refills | Status: DC | PRN

## 2015-12-24 NOTE — ED Notes (Signed)
Called patient to take her back.  No answer.

## 2015-12-24 NOTE — ED Provider Notes (Signed)
[facility]-EMERGENCY DEPT Provider Note   CSN: [id number] Arrival date & time: 12/24/15  1351  First Provider Contact:  First MD Initiated Contact with Patient 12/24/15 1422     By signing my name below, I, Ann Allen, attest that this documentation has been prepared under the direction and in the presence of non-physician practitioner, Cheri FowlerKayla Annet Manukyan, PA-C. Electronically Signed: Freida Busmaniana Allen, Scribe. 12/24/2015. 2:31 PM.    History   Chief Complaint Chief Complaint  Patient presents with  . Chest Pain  . Cough    The history is provided by the patient. No language interpreter was used.     HPI Comments:  Ann Allen is a 30 y.o. female who presents to the Emergency Department complaining of a productive cough x 3 days. She reports associated rhinorrhea, postussive SOB, myalgias, and central CP described as burning only with cough and deep inspiration. She has taken tylenol cold and flu with moderate relief. She denies exogenous hormone use, recent surgery/hospitalization, unilateral leg swelling, hemoptysis, hx of DVT/PE. Pt is a smoker; smokes ~ 2 black and milds a day and marijuana. No sick contacts.   History reviewed. No pertinent past medical history.  There are no active problems to display for this patient.   History reviewed. No pertinent surgical history.  OB History    No data available       Home Medications    Prior to Admission medications   Medication Sig Start Date End Date Taking? Authorizing Provider  albuterol (PROVENTIL HFA;VENTOLIN HFA) 108 (90 Base) MCG/ACT inhaler Inhale 2 puffs into the lungs every 4 (four) hours as needed for wheezing or shortness of breath. 12/24/15   Kadarrius Yanke, PA-C  benzonatate (TESSALON) 100 MG capsule Take 1 capsule (100 mg total) by mouth every 8 (eight) hours. 12/24/15   Cheri FowlerKayla Madgie Dhaliwal, PA-C  cetirizine (ZYRTEC ALLERGY) 10 MG tablet Take 1 tablet (10 mg total) by mouth daily. 10/13/15   Everlene FarrierWilliam Dansie, PA-C  fluticasone (FLONASE)  50 MCG/ACT nasal spray Place 2 sprays into both nostrils daily. 10/13/15   Everlene FarrierWilliam Dansie, PA-C  guaiFENesin (MUCINEX) 600 MG 12 hr tablet Take 1 tablet (600 mg total) by mouth 2 (two) times daily. 12/24/15   Cheri FowlerKayla Kieren Adkison, PA-C  metroNIDAZOLE (FLAGYL) 500 MG tablet Take 1 tablet (500 mg total) by mouth 2 (two) times daily. One po bid x 7 days 10/12/15   Mancel BaleElliott Wentz, MD  naproxen (NAPROSYN) 500 MG tablet Take 1 tablet (500 mg total) by mouth 2 (two) times daily with a meal. 10/13/15   Everlene FarrierWilliam Dansie, PA-C  ondansetron (ZOFRAN ODT) 4 MG disintegrating tablet Take 1 tablet (4 mg total) by mouth every 8 (eight) hours as needed for nausea or vomiting. 10/13/15   Everlene FarrierWilliam Dansie, PA-C  predniSONE (DELTASONE) 20 MG tablet Take 2 tablets (40 mg total) by mouth daily. 12/24/15   Cheri FowlerKayla Elizer Bostic, PA-C  Pseudoephed-APAP-Guaifenesin (TYLENOL SINUS SEVERE CONGEST) 30-325-200 MG TABS Take 2 tablets by mouth every 6 (six) hours as needed (for cold symptoms).    Historical Provider, MD    Family History No family history on file.  Social History Social History  Substance Use Topics  . Smoking status: Current Every Day Smoker  . Smokeless tobacco: Never Used  . Alcohol use Yes     Allergies   Review of patient's allergies indicates no known allergies.   Review of Systems Review of Systems  Constitutional: Positive for fever.  HENT: Positive for rhinorrhea. Negative for sore throat.   Respiratory: Positive  for cough and shortness of breath.   Cardiovascular: Positive for chest pain.  Musculoskeletal: Positive for myalgias.     Physical Exam Updated Vital Signs BP 118/83 (BP Location: Right Arm)   Pulse 95   Temp 100.2 F (37.9 C) (Oral)   Resp 16   LMP 12/24/2015 (Exact Date)   SpO2 96%   Physical Exam  Constitutional: She is oriented to person, place, and time. She appears well-developed and well-nourished.  Non-toxic appearance. She does not have a sickly appearance. She does not appear ill.    HENT:  Head: Normocephalic and atraumatic.  Right Ear: Tympanic membrane normal.  Left Ear: Tympanic membrane normal.  Nose: Nose normal.  Mouth/Throat: Oropharynx is clear and moist.  Eyes: Conjunctivae are normal. Pupils are equal, round, and reactive to light.  Neck: Normal range of motion. Neck supple.  Cardiovascular: Normal rate and regular rhythm.   Pulmonary/Chest: Effort normal. No accessory muscle usage or stridor. No respiratory distress. She has no wheezes. She has rhonchi. She has no rales.  Abdominal: Soft. Bowel sounds are normal. She exhibits no distension. There is no tenderness.  Musculoskeletal: Normal range of motion. She exhibits no edema.  No unilateral LE edema  Lymphadenopathy:    She has no cervical adenopathy.  Neurological: She is alert and oriented to person, place, and time.  Speech clear without dysarthria.  Skin: Skin is warm and dry.  Psychiatric: She has a normal mood and affect. Her behavior is normal.  Nursing note and vitals reviewed.    ED Treatments / Results  DIAGNOSTIC STUDIES:  Oxygen Saturation is 96% on RA, normal by my interpretation.    COORDINATION OF CARE:  2:30 PM Discussed treatment plan with pt at bedside and pt agreed to plan.  Labs (all labs ordered are listed, but only abnormal results are displayed) Labs Reviewed - No data to display  EKG  EKG Interpretation None       Radiology Dg Chest 2 View  Result Date: 12/24/2015 CLINICAL DATA:  Productive cough EXAM: CHEST  2 VIEW COMPARISON:  None. FINDINGS: The heart size and mediastinal contours are within normal limits. Both lungs are clear. The visualized skeletal structures are unremarkable. IMPRESSION: No active cardiopulmonary disease. Electronically Signed   By: Alcide Clever M.D.   On: 12/24/2015 15:14    Procedures Procedures (including critical care time)  Medications Ordered in ED Medications  ipratropium-albuterol (DUONEB) 0.5-2.5 (3) MG/3ML nebulizer  solution 3 mL (3 mLs Nebulization Given 12/24/15 1435)  ibuprofen (ADVIL,MOTRIN) tablet 800 mg (800 mg Oral Given 12/24/15 1439)     Initial Impression / Assessment and Plan / ED Course  I have reviewed the triage vital signs and the nursing notes.  Pertinent labs & imaging results that were available during my care of the patient were reviewed by me and considered in my medical decision making (see chart for details).  Clinical Course   Pt symptoms consistent with URI. Low risk PE using Well's criteria, and patient is PERC negative.  CXR negative for acute infiltrate. Pt will be discharged with symptomatic treatment.  Discussed return precautions including worsening constant chest pain, shortness of breath, or hemoptysis.  Pt is hemodynamically stable & in NAD prior to discharge.  Final Clinical Impressions(s) / ED Diagnoses   Final diagnoses:  URI (upper respiratory infection)    New Prescriptions New Prescriptions   ALBUTEROL (PROVENTIL HFA;VENTOLIN HFA) 108 (90 BASE) MCG/ACT INHALER    Inhale 2 puffs into the lungs every 4 (  four) hours as needed for wheezing or shortness of breath.   BENZONATATE (TESSALON) 100 MG CAPSULE    Take 1 capsule (100 mg total) by mouth every 8 (eight) hours.   GUAIFENESIN (MUCINEX) 600 MG 12 HR TABLET    Take 1 tablet (600 mg total) by mouth 2 (two) times daily.   PREDNISONE (DELTASONE) 20 MG TABLET    Take 2 tablets (40 mg total) by mouth daily.    I personally performed the services described in this documentation, which was scribed in my presence. The recorded information has been reviewed and is accurate.     Cheri Fowler, PA-C 12/24/15 1534    Shaune Pollack, MD 12/26/15 [phone]

## 2015-12-24 NOTE — ED Notes (Signed)
C/o CP with cough and fever x 3 days. Denies sore throat. States has mild headache.

## 2015-12-24 NOTE — ED Notes (Signed)
Patient transported to X-ray 

## 2015-12-24 NOTE — ED Triage Notes (Signed)
Cough  And it makes her chest hurt to cough, states just bad from a trip

## 2016-02-23 ENCOUNTER — Emergency Department (HOSPITAL_COMMUNITY)
Admission: EM | Admit: 2016-02-23 | Discharge: 2016-02-23 | Disposition: A | Discharge status: Home / Self Care | Payer: Medicaid Other | Attending: Emergency Medicine | Admitting: Emergency Medicine

## 2016-02-23 ENCOUNTER — Encounter (HOSPITAL_COMMUNITY): Payer: Self-pay | Admitting: *Deleted

## 2016-02-23 DIAGNOSIS — S01112A Laceration without foreign body of left eyelid and periocular area, initial encounter: Secondary | ICD-10-CM

## 2016-02-23 DIAGNOSIS — S01111A Laceration without foreign body of right eyelid and periocular area, initial encounter: Secondary | ICD-10-CM | POA: Insufficient documentation

## 2016-02-23 DIAGNOSIS — F17200 Nicotine dependence, unspecified, uncomplicated: Secondary | ICD-10-CM | POA: Diagnosis not present

## 2016-02-23 DIAGNOSIS — Y999 Unspecified external cause status: Secondary | ICD-10-CM | POA: Diagnosis not present

## 2016-02-23 DIAGNOSIS — Z23 Encounter for immunization: Secondary | ICD-10-CM | POA: Insufficient documentation

## 2016-02-23 DIAGNOSIS — H00016 Hordeolum externum left eye, unspecified eyelid: Secondary | ICD-10-CM

## 2016-02-23 DIAGNOSIS — Y929 Unspecified place or not applicable: Secondary | ICD-10-CM | POA: Insufficient documentation

## 2016-02-23 DIAGNOSIS — Y939 Activity, unspecified: Secondary | ICD-10-CM | POA: Diagnosis not present

## 2016-02-23 DIAGNOSIS — H00015 Hordeolum externum left lower eyelid: Secondary | ICD-10-CM | POA: Diagnosis not present

## 2016-02-23 MED ORDER — TETANUS-DIPHTH-ACELL PERTUSSIS 5-2.5-18.5 LF-MCG/0.5 IM SUSP
0.5000 mL | Freq: Once | INTRAMUSCULAR | Status: AC
  Administered 2016-02-23: 0.5 mL via INTRAMUSCULAR
  Filled 2016-02-23: qty 0.5

## 2016-02-23 MED ORDER — LIDOCAINE HCL (PF) 1 % IJ SOLN
5.0000 mL | Freq: Once | INTRAMUSCULAR | Status: AC
  Administered 2016-02-23: 5 mL via INTRADERMAL
  Filled 2016-02-23: qty 5
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
## 2016-02-23 NOTE — Discharge Instructions (Signed)
Have the sutures removed in 3-5 days
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
## 2016-02-27 ENCOUNTER — Encounter (HOSPITAL_COMMUNITY): Payer: Self-pay

## 2016-02-27 ENCOUNTER — Emergency Department (HOSPITAL_COMMUNITY): Payer: Medicaid Other

## 2016-02-27 ENCOUNTER — Emergency Department (HOSPITAL_COMMUNITY)
Admission: EM | Admit: 2016-02-27 | Discharge: 2016-02-27 | Disposition: A | Discharge status: Home / Self Care | Payer: Medicaid Other | Attending: Emergency Medicine | Admitting: Emergency Medicine

## 2016-02-27 DIAGNOSIS — Y999 Unspecified external cause status: Secondary | ICD-10-CM | POA: Insufficient documentation

## 2016-02-27 DIAGNOSIS — Y929 Unspecified place or not applicable: Secondary | ICD-10-CM | POA: Diagnosis not present

## 2016-02-27 DIAGNOSIS — S60222A Contusion of left hand, initial encounter: Secondary | ICD-10-CM

## 2016-02-27 DIAGNOSIS — Y9389 Activity, other specified: Secondary | ICD-10-CM | POA: Insufficient documentation

## 2016-02-27 DIAGNOSIS — S6992XA Unspecified injury of left wrist, hand and finger(s), initial encounter: Secondary | ICD-10-CM | POA: Diagnosis present

## 2016-02-27 DIAGNOSIS — S60221A Contusion of right hand, initial encounter: Secondary | ICD-10-CM | POA: Insufficient documentation

## 2016-02-27 DIAGNOSIS — Z4802 Encounter for removal of sutures: Secondary | ICD-10-CM | POA: Diagnosis not present

## 2016-02-27 DIAGNOSIS — M79644 Pain in right finger(s): Secondary | ICD-10-CM | POA: Diagnosis not present

## 2016-02-27 MED ORDER — NAPROXEN 250 MG PO TABS: 250.0000 mg | ORAL_TABLET | Freq: Two times a day (BID) | ORAL | 0 refills | Status: DC

## 2016-02-27 MED ORDER — NAPROXEN 250 MG PO TABS
500.0000 mg | ORAL_TABLET | Freq: Once | ORAL | Status: AC
  Administered 2016-02-27: 500 mg via ORAL
  Filled 2016-02-27: qty 2

## 2016-02-27 NOTE — ED Notes (Signed)
Declined W/C at D/C and was escorted to lobby by RN. 
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
## 2016-05-01 ENCOUNTER — Emergency Department (HOSPITAL_COMMUNITY)
Admission: EM | Admit: 2016-05-01 | Discharge: 2016-05-01 | Disposition: A | Discharge status: Home / Self Care | Payer: No Typology Code available for payment source | Attending: Emergency Medicine | Admitting: Emergency Medicine

## 2016-05-01 ENCOUNTER — Encounter (HOSPITAL_COMMUNITY): Payer: Self-pay | Admitting: *Deleted

## 2016-05-01 DIAGNOSIS — Y999 Unspecified external cause status: Secondary | ICD-10-CM | POA: Insufficient documentation

## 2016-05-01 DIAGNOSIS — Y92410 Unspecified street and highway as the place of occurrence of the external cause: Secondary | ICD-10-CM | POA: Insufficient documentation

## 2016-05-01 DIAGNOSIS — M545 Low back pain, unspecified: Secondary | ICD-10-CM

## 2016-05-01 DIAGNOSIS — Y939 Activity, unspecified: Secondary | ICD-10-CM | POA: Insufficient documentation

## 2016-05-01 DIAGNOSIS — S199XXA Unspecified injury of neck, initial encounter: Secondary | ICD-10-CM | POA: Diagnosis not present

## 2016-05-01 DIAGNOSIS — M542 Cervicalgia: Secondary | ICD-10-CM

## 2016-05-01 MED ORDER — NAPROXEN 250 MG PO TABS: 250.0000 mg | ORAL_TABLET | Freq: Two times a day (BID) | ORAL | 0 refills | Status: DC

## 2016-05-01 MED ORDER — METHOCARBAMOL 500 MG PO TABS: 500.0000 mg | ORAL_TABLET | Freq: Two times a day (BID) | ORAL | 0 refills | Status: DC | PRN
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
## 2016-05-01 NOTE — ED Notes (Signed)
Pt. Is "sore" in neck & has headache.

## 2016-06-23 ENCOUNTER — Emergency Department (HOSPITAL_COMMUNITY)
Admission: EM | Admit: 2016-06-23 | Discharge: 2016-06-23 | Disposition: A | Discharge status: Home / Self Care | Payer: Medicaid Other | Attending: Emergency Medicine | Admitting: Emergency Medicine

## 2016-06-23 ENCOUNTER — Encounter (HOSPITAL_COMMUNITY): Payer: Self-pay | Admitting: *Deleted

## 2016-06-23 DIAGNOSIS — B9689 Other specified bacterial agents as the cause of diseases classified elsewhere: Secondary | ICD-10-CM

## 2016-06-23 DIAGNOSIS — N760 Acute vaginitis: Secondary | ICD-10-CM | POA: Insufficient documentation

## 2016-06-23 DIAGNOSIS — N898 Other specified noninflammatory disorders of vagina: Secondary | ICD-10-CM | POA: Diagnosis present

## 2016-06-23 LAB — WET PREP, GENITAL
Sperm: NONE SEEN
TRICH WET PREP: NONE SEEN
Yeast Wet Prep HPF POC: NONE SEEN

## 2016-06-23 LAB — URINALYSIS, ROUTINE W REFLEX MICROSCOPIC
Bilirubin Urine: NEGATIVE
Glucose, UA: NEGATIVE mg/dL
Hgb urine dipstick: NEGATIVE
Ketones, ur: NEGATIVE mg/dL
Leukocytes, UA: NEGATIVE
Nitrite: NEGATIVE
Protein, ur: NEGATIVE mg/dL
Specific Gravity, Urine: 1.013 (ref 1.005–1.030)
pH: 5 (ref 5.0–8.0)

## 2016-06-23 LAB — PREGNANCY, URINE: Preg Test, Ur: NEGATIVE

## 2016-06-23 MED ORDER — FLUCONAZOLE 100 MG PO TABS
150.0000 mg | ORAL_TABLET | Freq: Every day | ORAL | Status: DC
  Administered 2016-06-23: 150 mg via ORAL
  Filled 2016-06-23: qty 2

## 2016-06-23 MED ORDER — METRONIDAZOLE 500 MG PO TABS: 500.0000 mg | ORAL_TABLET | Freq: Two times a day (BID) | ORAL | 0 refills | Status: DC

## 2016-06-23 NOTE — ED Notes (Signed)
Pt stat she understands instructions. Home stable with steady gait. 

## 2016-06-23 NOTE — ED Provider Notes (Signed)
[facility]PT Provider Note   CSN: [id number] Arrival date & time: 06/23/16  1253  By signing my name below, I, Alyssa Grove, attest that this documentation has been prepared under the direction and in the presence of Kerrie Buffalo, NP. Electronically Signed: Alyssa Grove, ED Scribe. 06/23/16. 5:06 PM.  History   Chief Complaint Chief Complaint  Patient presents with  . Vaginal Itching   The history is provided by the patient. No language interpreter was used.    HPI Comments: Ann Allen is a 31 y.o. female who presents to the Emergency Department complaining of gradual onset, constant, moderate vaginal itching for the past 4 days. She has tried Diflucan with no relief to symptoms. Pt reports associated malodorous vaginal discharge and slight episodic dysuria. She has PMHx of Gonorrhea or Chlamydia (unsure of which) and BV. Pt denies PMHx of vaginal trichomoniasis. She states symptoms are similar to prior BV. LMP was in the beginning of January. Pt has had a new partner with unprotected sex for the last 2 months.   History reviewed. No pertinent past medical history.  There are no active problems to display for this patient.   Past Surgical History:  Procedure Laterality Date  . BREAST SURGERY     breast augmentation  . CESAREAN SECTION N/A    2 c-sections    OB History    No data available       Home Medications    Prior to Admission medications   Medication Sig Start Date End Date Taking? Authorizing Provider  albuterol (PROVENTIL HFA;VENTOLIN HFA) 108 (90 Base) MCG/ACT inhaler Inhale 2 puffs into the lungs every 4 (four) hours as needed for wheezing or shortness of breath. 12/24/15   Kayla Rose, PA-C  benzonatate (TESSALON) 100 MG capsule Take 1 capsule (100 mg total) by mouth every 8 (eight) hours. 12/24/15   Cheri Fowler, PA-C  cetirizine (ZYRTEC ALLERGY) 10 MG tablet Take 1 tablet (10 mg total) by mouth daily. 10/13/15   Everlene Farrier, PA-C  fluticasone (FLONASE)  50 MCG/ACT nasal spray Place 2 sprays into both nostrils daily. 10/13/15   Everlene Farrier, PA-C  guaiFENesin (MUCINEX) 600 MG 12 hr tablet Take 1 tablet (600 mg total) by mouth 2 (two) times daily. 12/24/15   Cheri Fowler, PA-C  methocarbamol (ROBAXIN) 500 MG tablet Take 1 tablet (500 mg total) by mouth 2 (two) times daily as needed for muscle spasms. 05/01/16   Everlene Farrier, PA-C  metroNIDAZOLE (FLAGYL) 500 MG tablet Take 1 tablet (500 mg total) by mouth 2 (two) times daily. 06/23/16   Cristan Scherzer Orlene Och, NP  naproxen (NAPROSYN) 250 MG tablet Take 1 tablet (250 mg total) by mouth 2 (two) times daily with a meal. 05/01/16   Everlene Farrier, PA-C  ondansetron (ZOFRAN ODT) 4 MG disintegrating tablet Take 1 tablet (4 mg total) by mouth every 8 (eight) hours as needed for nausea or vomiting. 10/13/15   Everlene Farrier, PA-C  predniSONE (DELTASONE) 20 MG tablet Take 2 tablets (40 mg total) by mouth daily. 12/24/15   Cheri Fowler, PA-C  Pseudoephed-APAP-Guaifenesin (TYLENOL SINUS SEVERE CONGEST) 30-325-200 MG TABS Take 2 tablets by mouth every 6 (six) hours as needed (for cold symptoms).    Historical Provider, MD    Family History No family history on file.  Social History Social History  Substance Use Topics  . Smoking status: Never Smoker  . Smokeless tobacco: Never Used  . Alcohol use Yes     Comment: occ  Allergies   Patient has no known allergies.   Review of Systems Review of Systems  Constitutional: Negative for fever.  Genitourinary: Positive for vaginal discharge and vaginal pain (burning). Negative for frequency, pelvic pain and vaginal bleeding.       +Vaginal itching     Physical Exam Updated Vital Signs BP 121/65 (BP Location: Right Arm)   Pulse (!) 56   Temp 98.4 F (36.9 C) (Oral)   Resp 18   Ht 5\' 1"  (1.549 m)   Wt 70.3 kg   LMP 05/17/2016   SpO2 100%   BMI 29.29 kg/m   Physical Exam  Constitutional: She is oriented to person, place, and time. She appears  well-developed and well-nourished. She is active. No distress.  HENT:  Head: Normocephalic and atraumatic.  Eyes: Conjunctivae and EOM are normal.  Neck: Neck supple.  Cardiovascular: Bradycardia present.   Pulmonary/Chest: Effort normal.  Abdominal: Soft. There is no tenderness.  Genitourinary:  Genitourinary Comments: External genitalia without lesions, frothy malodorous  d/c vaginal vault vault, no CMT, no adnexal tenderness or mass palpated, uterus without palpable enlargement.   Musculoskeletal: Normal range of motion.  Neurological: She is alert and oriented to person, place, and time.  Skin: Skin is warm and dry.  Psychiatric: She has a normal mood and affect. Her behavior is normal.  Nursing note and vitals reviewed.   ED Treatments / Results  DIAGNOSTIC STUDIES: Oxygen Saturation is 100% on RA, normal by my interpretation.    COORDINATION OF CARE: 4:56 PM Discussed treatment plan with pt at bedside which includes pelvic exam and pt agreed to plan. She declined HIV and Syphilis screening due to recent negative screening results.  Labs (all labs ordered are listed, but only abnormal results are displayed) Labs Reviewed  WET PREP, GENITAL - Abnormal; Notable for the following:       Result Value   Clue Cells Wet Prep HPF POC PRESENT (*)    WBC, Wet Prep HPF POC FEW (*)    All other components within normal limits  URINALYSIS, ROUTINE W REFLEX MICROSCOPIC  PREGNANCY, URINE  POC URINE PREG, ED  GC/CHLAMYDIA PROBE AMP ([place]) NOT AT [facility]   Radiology No results found.  Procedures Procedures (including critical care time)  Medications Ordered in ED Medications - No data to display   Initial Impression / Assessment and Plan / ED Course  I have reviewed the triage vital signs and the nursing notes.  Pertinent labs results that were available during my care of the patient were reviewed by me and considered in my medical decision making (see chart for  details).   I personally performed the services described in this documentation, which was scribed in my presence. The recorded information has been reviewed and is accurate.   Final Clinical Impressions(s) / ED Diagnoses  31 y.o. female with vaginal d/c with odor stable for d/c without abdominal pain, fever or signs of PID. Cultures for GC and Chlamydia pending. Patient request diflucan stating she always gets a yeast infection with antibiotics. I discussed with the patient that since she took Diflucan 2 days ago that she may not get a yeast infection. Patient insistent on taking another Diflucan. Diflucan 150 mg PO given prior to d/c.  Referral to [facility] for further STI testing in the future.   Final diagnoses:  Bacterial vaginosis    New Prescriptions Discharge Medication List as of 06/23/2016  5:25 PM       Jordyn Hofacker Orlene Och,  NP 06/24/16 [id number]    Maia PlanJoshua G Long, MD 06/24/16 1351

## 2016-06-23 NOTE — ED Triage Notes (Signed)
Pt states vaginal itching and "musky" smelling discharge x 1 week.  She took 1 diflucan with no relief.

## 2016-06-24 NOTE — ED Notes (Addendum)
Pt called requesting to speak to charge RN.  PT is upset yelling and cursing.  States she was suppose to get Diflucan prescription.  Charge RN spoke with Kerrie BuffaloHope Neese, NP that saw pt and she states that pt was only to get 1 dose in ED.  Pt had taken Diflucan 2 days ago as well.  Multiple attempts to explain this to patient and she keeps yelling and cursing and stating that she has to have Diflucan.  I informed pt that [facility] aware that pt was requesting Diflucan prescription but stated that she did not need the prescription.

## 2016-06-26 LAB — GC/CHLAMYDIA PROBE AMP (~~LOC~~) NOT AT ARMC
CHLAMYDIA, DNA PROBE: NEGATIVE
NEISSERIA GONORRHEA: NEGATIVE

## 2016-07-01 ENCOUNTER — Emergency Department (HOSPITAL_COMMUNITY)
Admission: EM | Admit: 2016-07-01 | Discharge: 2016-07-01 | Disposition: A | Discharge status: Home / Self Care | Payer: Medicaid Other | Attending: Emergency Medicine | Admitting: Emergency Medicine

## 2016-07-01 ENCOUNTER — Encounter (HOSPITAL_COMMUNITY): Payer: Self-pay

## 2016-07-01 DIAGNOSIS — N750 Cyst of Bartholin's gland: Secondary | ICD-10-CM | POA: Diagnosis present

## 2016-07-01 LAB — WET PREP, GENITAL
Clue Cells Wet Prep HPF POC: NONE SEEN
Sperm: NONE SEEN
TRICH WET PREP: NONE SEEN
YEAST WET PREP: NONE SEEN

## 2016-07-01 MED ORDER — SODIUM BICARBONATE 8.4 % IV SOLN
50.0000 meq | Freq: Once | INTRAVENOUS | Status: AC
  Administered 2016-07-01: 50 meq via INTRAVENOUS
  Filled 2016-07-01: qty 50

## 2016-07-01 MED ORDER — LIDOCAINE HCL (PF) 1 % IJ SOLN
5.0000 mL | Freq: Once | INTRAMUSCULAR | Status: AC
  Administered 2016-07-01: 5 mL
  Filled 2016-07-01: qty 5

## 2016-07-01 NOTE — ED Notes (Signed)
Assisted edp with I&D of abscess

## 2016-07-01 NOTE — ED Provider Notes (Signed)
[facility] Provider Note   CSN: [id number] Arrival date & time: 07/01/16  1220     History   Chief Complaint No chief complaint on file.   HPI Ann Allen is a 31 y.o. female complains of having a "yeast infection" since starting flagyl that was given her last Friday when she was seen. She also reports a "cyst" for 2 days. She reports being diagnosed with BV prior visit. She reports associated vaginal itching, vaginal discharge, and vaginal pain where she has a "cyst." She states her pain is constant, worsening, achy, and 8/10. She denies urinary symptoms, dysuria, back pain, fevers, chills, nausea, vomiting. LMP 2/10. She reports regular menstrual cycle. She denies trying anything at home for her symptoms. Nothing makes it better or worse.   The history is provided by the patient. No language interpreter was used.    History reviewed. No pertinent past medical history.  There are no active problems to display for this patient.   Past Surgical History:  Procedure Laterality Date  . BREAST SURGERY     breast augmentation  . CESAREAN SECTION N/A    2 c-sections    OB History    No data available       Home Medications    Prior to Admission medications   Medication Sig Start Date End Date Taking? Authorizing Provider  metroNIDAZOLE (FLAGYL) 500 MG tablet Take 1 tablet (500 mg total) by mouth 2 (two) times daily. 06/23/16  Yes Hope Orlene Och, NP  albuterol (PROVENTIL HFA;VENTOLIN HFA) 108 (90 Base) MCG/ACT inhaler Inhale 2 puffs into the lungs every 4 (four) hours as needed for wheezing or shortness of breath. 12/24/15   Kayla Rose, PA-C  benzonatate (TESSALON) 100 MG capsule Take 1 capsule (100 mg total) by mouth every 8 (eight) hours. Patient not taking: Reported on 07/01/2016 12/24/15   Cheri Fowler, PA-C  cetirizine (ZYRTEC ALLERGY) 10 MG tablet Take 1 tablet (10 mg total) by mouth daily. Patient not taking: Reported on 07/01/2016 10/13/15   Everlene Farrier, PA-C    fluticasone [facility]) 50 MCG/ACT nasal spray Place 2 sprays into both nostrils daily. Patient not taking: Reported on 07/01/2016 10/13/15   Everlene Farrier, PA-C  guaiFENesin (MUCINEX) 600 MG 12 hr tablet Take 1 tablet (600 mg total) by mouth 2 (two) times daily. Patient not taking: Reported on 07/01/2016 12/24/15   Cheri Fowler, PA-C  methocarbamol (ROBAXIN) 500 MG tablet Take 1 tablet (500 mg total) by mouth 2 (two) times daily as needed for muscle spasms. Patient not taking: Reported on 07/01/2016 05/01/16   Everlene Farrier, PA-C  naproxen (NAPROSYN) 250 MG tablet Take 1 tablet (250 mg total) by mouth 2 (two) times daily with a meal. Patient not taking: Reported on 07/01/2016 05/01/16   Everlene Farrier, PA-C  ondansetron (ZOFRAN ODT) 4 MG disintegrating tablet Take 1 tablet (4 mg total) by mouth every 8 (eight) hours as needed for nausea or vomiting. Patient not taking: Reported on 07/01/2016 10/13/15   Everlene Farrier, PA-C  predniSONE (DELTASONE) 20 MG tablet Take 2 tablets (40 mg total) by mouth daily. Patient not taking: Reported on 07/01/2016 12/24/15   Cheri Fowler, PA-C    Family History No family history on file.  Social History Social History  Substance Use Topics  . Smoking status: Never Smoker  . Smokeless tobacco: Never Used  . Alcohol use Yes     Comment: occ     Allergies   Patient has no known allergies.   Review  of Systems Review of Systems  Constitutional: Negative for chills and fever.  Gastrointestinal: Negative for diarrhea, nausea and vomiting.  Genitourinary: Positive for vaginal discharge and vaginal pain. Negative for difficulty urinating, dysuria and vaginal bleeding.     Physical Exam Updated Vital Signs BP 114/79   Pulse 75   Temp 98.3 F (36.8 C) (Oral)   Resp 18   SpO2 100%   Physical Exam  Constitutional: She appears well-developed and well-nourished.  Well appearing  HENT:  Head: Normocephalic and atraumatic.  Nose: Nose normal.  Eyes:  Conjunctivae and EOM are normal.  Neck: Normal range of motion.  Cardiovascular: Normal rate.   Pulmonary/Chest: Effort normal and breath sounds normal. No respiratory distress.  Normal work of breathing. No respiratory distress noted.   Abdominal: Soft. There is no tenderness. There is no rebound and no guarding.  Soft and non tender. No rebound or guarding. No CVA tenderness  Genitourinary: No vaginal discharge found.  Genitourinary Comments: Chaperone present.  Bulge/cyst like structure located at 4 o'clock left labia. No surrounding redness or signs of cellulitis noted. Mild to moderate tenderness to palpation.   Musculoskeletal: Normal range of motion.  Neurological: She is alert.  Skin: Skin is warm.  Psychiatric: She has a normal mood and affect. Her behavior is normal.  Nursing note and vitals reviewed.    ED Treatments / Results  Labs (all labs ordered are listed, but only abnormal results are displayed) Labs Reviewed  WET PREP, GENITAL - Abnormal; Notable for the following:       Result Value   WBC, Wet Prep HPF POC MANY (*)    All other components within normal limits    EKG  EKG Interpretation None       Radiology No results found.  Procedures .Marland Kitchen.Incision and Drainage Date/Time: 07/01/2016 4:32 PM Performed by: Alvina ChouESPINA, Pearle Wandler MANUEL Authorized by: Alvina ChouESPINA, Carrah Eppolito MANUEL   Consent:    Consent obtained:  Verbal   Consent given by:  Patient   Risks discussed:  Bleeding, infection, incomplete drainage, pain and damage to other organs   Alternatives discussed:  No treatment, delayed treatment and alternative treatment Location:    Type:  Bartholin cyst   Size:  3 cm   Location: Left labia. Pre-procedure details:    Skin preparation:  Betadine Anesthesia (see MAR for exact dosages):    Anesthesia method:  Local infiltration   Local anesthetic:  Lidocaine 1% w/o epi Procedure type:    Complexity:  Simple Procedure details:    Needle aspiration: no       Incision types:  Stab incision   Incision depth:  Dermal   Scalpel blade:  11   Wound management:  Probed and deloculated and irrigated with saline   Drainage:  Serosanguinous   Drainage amount:  Copious   Wound treatment:  Drain placed and wound left open   Packing materials:  Word catheter Post-procedure details:    Patient tolerance of procedure:  Tolerated well, no immediate complications   (including critical care time)  Medications Ordered in ED Medications  lidocaine (PF) (XYLOCAINE) 1 % injection 5 mL (5 mLs Infiltration Given 07/01/16 1649)  sodium bicarbonate injection 50 mEq (50 mEq Intravenous Given 07/01/16 1649)     Initial Impression / Assessment and Plan / ED Course  I have reviewed the triage vital signs and the nursing notes.  Pertinent labs & imaging results that were available during my care of the patient were reviewed by me and considered  in my medical decision making (see chart for details).    Patient with apparent bartholin cyst amenable to incision and drainage. Word catheter placed,  wound recheck in 2 days. Patient declined pelvic exam but agreed to self swab for wet prep. Wet prep negative. Encouraged home warm soaks and flushing.  No signs of cellulitis is surrounding skin.  Will d/c to home.  No antibiotic therapy is indicated. [facility] Reasons to immediately return to ED discussed. Financial resource guide also given.   Final Clinical Impressions(s) / ED Diagnoses   Final diagnoses:  Bartholin cyst    New Prescriptions Discharge Medication List as of 07/01/2016  4:40 PM       [address] [place], [place] 07/01/16 1748    Lavera Guise, MD 07/02/16 1329

## 2016-07-01 NOTE — ED Notes (Signed)
The patient is very upset that she is still waiting for treatment and does not want another pelvic exam, explained to the patient that she had to be re-examined incase there was something going on, she agrees to letting the doctor examine her but would like to try to avoid the complete pelvic exam

## 2016-07-01 NOTE — Discharge Instructions (Signed)
Please practice good hygiene. Remove catheter in 24 hours. [facility] regarding today's visit.   How is this prevented? Take these steps to help prevent a Bartholin cyst from returning: Practice good hygiene. Clean your vaginal area with mild soap and a soft cloth when you bathe. Practice safe sex to prevent STDs. Contact a health care provider if: You have increased pain, swelling, or redness in the area of the cyst. Puslike drainage is coming from the cyst. You have a fever.

## 2016-07-01 NOTE — ED Notes (Signed)
Patient requested to just self swab for the wet prep to see if she does have a yeast infection, edp okay'd and explained to patient we need to make sure it is yeast before we treat it.

## 2016-07-01 NOTE — ED Triage Notes (Signed)
Patient complains  Of just completing flagyl for BV and no has thick vaginal discharge and states she thinks and abscess on her labia

## 2016-07-01 NOTE — ED Notes (Signed)
Went in with edp to examine patient and the patient has a large abscess that will need to be drained, explained to the patient and she seems very appreciative of our patience and explanation of findings

## 2016-07-04 DIAGNOSIS — N758 Other diseases of Bartholin's gland: Secondary | ICD-10-CM | POA: Insufficient documentation

## 2016-07-04 DIAGNOSIS — R102 Pelvic and perineal pain: Secondary | ICD-10-CM | POA: Diagnosis present

## 2016-07-04 DIAGNOSIS — Z79899 Other long term (current) drug therapy: Secondary | ICD-10-CM | POA: Diagnosis not present

## 2016-07-04 DIAGNOSIS — F17200 Nicotine dependence, unspecified, uncomplicated: Secondary | ICD-10-CM | POA: Diagnosis not present

## 2016-07-05 ENCOUNTER — Encounter (HOSPITAL_COMMUNITY): Payer: Self-pay

## 2016-07-05 ENCOUNTER — Emergency Department (HOSPITAL_COMMUNITY)
Admission: EM | Admit: 2016-07-05 | Discharge: 2016-07-05 | Disposition: A | Discharge status: Home / Self Care | Payer: Medicaid Other | Attending: Emergency Medicine | Admitting: Emergency Medicine

## 2016-07-05 DIAGNOSIS — N758 Other diseases of Bartholin's gland: Secondary | ICD-10-CM

## 2016-07-05 HISTORY — DX: Cyst of Bartholin's gland: N75.0

## 2016-07-05 MED ORDER — LIDOCAINE-EPINEPHRINE (PF) 2 %-1:200000 IJ SOLN
20.0000 mL | Freq: Once | INTRAMUSCULAR | Status: DC
  Filled 2016-07-05: qty 20

## 2016-07-05 MED ORDER — HYDROCODONE-ACETAMINOPHEN 5-325 MG PO TABS: 1.0000 | ORAL_TABLET | Freq: Four times a day (QID) | ORAL | 0 refills | Status: DC | PRN

## 2016-07-05 MED ORDER — ONDANSETRON 4 MG PO TBDP: 4.0000 mg | ORAL_TABLET | Freq: Three times a day (TID) | ORAL | 0 refills | Status: DC | PRN

## 2016-07-05 MED ORDER — OXYCODONE-ACETAMINOPHEN 5-325 MG PO TABS
1.0000 | ORAL_TABLET | Freq: Once | ORAL | Status: AC
  Administered 2016-07-05: 1 via ORAL
  Filled 2016-07-05: qty 1

## 2016-07-05 NOTE — ED Notes (Signed)
Pt departed in NAD, refused use of wheelchair.  

## 2016-07-05 NOTE — ED Triage Notes (Signed)
Pt states she is here for Barth cyst; pt states cyst was drained on Friday but it feel like cyst has filled back up; pt c/o at 9/10 with pain to sit or walk. Pt a&o 3 on arrival.

## 2016-07-05 NOTE — ED Provider Notes (Signed)
[facility] Provider Note   CSN: [id number] Arrival date & time: 07/04/16  2337  By signing my name below, I, Arianna Nassar, attest that this documentation has been prepared under the direction and in the presence of Shon Baton, MD.  Electronically Signed: Octavia Heir, ED Scribe. 07/05/16. 3:46 AM.    History   Chief Complaint No chief complaint on file.   HPI HPI Comments: Ann Allen is a 31 y.o. female who presents to the Emergency Department complaining of a persistently worsening, 8/10, vaginal cyst for the past several days. She describes the pain as a "tight sensation". Pt was seen on 07/01/16 and was diagnosed with a Bartholin cyst which she had an I&D performed. She expresses having a word catheter placed and was supposed to have it removed 24 hours later, but she expresses it came out 2 hours after it was placed. she says ever since then, she believes her cyst is getting gradually worse. Pt states that warm compresses increases her pain. She was seen by her PCP two days ago for a follow up and was prescribed doxycycline. Pt notes taking ibuprofen and aleve to modify her pain with relief. She denies fever. Pt also does not have any concern for STDs.  The history is provided by the patient.    Past Medical History:  Diagnosis Date  . Bartholin cyst     There are no active problems to display for this patient.   Past Surgical History:  Procedure Laterality Date  . BREAST SURGERY     breast augmentation  . CESAREAN SECTION N/A    2 c-sections    OB History    No data available       Home Medications    Prior to Admission medications   Medication Sig Start Date End Date Taking? Authorizing Provider  albuterol (PROVENTIL HFA;VENTOLIN HFA) 108 (90 Base) MCG/ACT inhaler Inhale 2 puffs into the lungs every 4 (four) hours as needed for wheezing or shortness of breath. 12/24/15  Yes Kayla Rose, PA-C  doxycycline (VIBRA-TABS) 100 MG tablet Take 100  mg by mouth 2 (two) times daily.   Yes Historical Provider, MD  HYDROcodone-acetaminophen (NORCO/VICODIN) 5-325 MG tablet Take 1 tablet by mouth every 6 (six) hours as needed. 07/05/16   Shon Baton, MD  ondansetron (ZOFRAN ODT) 4 MG disintegrating tablet Take 1 tablet (4 mg total) by mouth every 8 (eight) hours as needed for nausea or vomiting. 07/05/16   Shon Baton, MD    Family History No family history on file.  Social History Social History  Substance Use Topics  . Smoking status: Current Every Day Smoker  . Smokeless tobacco: Never Used  . Alcohol use Yes     Comment: occ     Allergies   Patient has no known allergies.   Review of Systems Review of Systems  Constitutional: Negative for fever.  Genitourinary: Positive for vaginal pain. Negative for vaginal bleeding and vaginal discharge.  Skin: Positive for wound. Negative for color change.     Physical Exam Updated Vital Signs BP 113/79   Pulse 78   Temp 97.5 F (36.4 C) (Oral)   Resp 18   LMP 06/26/2016   SpO2 100%   Physical Exam  Constitutional: She is oriented to person, place, and time. She appears well-developed and well-nourished.  HENT:  Head: Normocephalic and atraumatic.  Cardiovascular: Normal rate and regular rhythm.   Pulmonary/Chest: Effort normal. No respiratory distress.  Genitourinary:  Genitourinary Comments:  External vaginal exam with notable swelling and circular mass over the left labia proximal 0.2 cm in diameter, no overlying skin changes or crepitus, prior incision approximately 1 cm noted on the inferior aspect of the left labia, no spontaneous drainage noted  Neurological: She is alert and oriented to person, place, and time.  Skin: Skin is warm and dry.  Psychiatric: She has a normal mood and affect.  Nursing note and vitals reviewed.    ED Treatments / Results  DIAGNOSTIC STUDIES: Oxygen Saturation is 100% on RA, normal by my interpretation.  COORDINATION OF  CARE: 3:50 AM Discussed treatment plan with pt at bedside and pt agreed to plan.   Labs (all labs ordered are listed, but only abnormal results are displayed) Labs Reviewed - No data to display  EKG  EKG Interpretation None       Radiology No results found.  Procedures Procedures (including critical care time)  Medications Ordered in ED Medications  lidocaine-EPINEPHrine (XYLOCAINE W/EPI) 2 %-1:200000 (PF) injection 20 mL (20 mLs Infiltration Refused 07/05/16 0609)  oxyCODONE-acetaminophen (PERCOCET/ROXICET) 5-325 MG per tablet 1 tablet (1 tablet Oral Given 07/05/16 0609)     Initial Impression / Assessment and Plan / ED Course  I have reviewed the triage vital signs and the nursing notes.  Pertinent labs & imaging results that were available during my care of the patient were reviewed by me and considered in my medical decision making (see chart for details).     Patient presents with persistent Bartholin gland pain and swelling. Her port catheter fell out prematurely. She reports persistent pain. [facility] Discussed with patient options including continued supportive measures at home with sitz baths and warm compresses and [facility]. Also discussed additional lancing of the cyst. Patient is very tearful. She reports that the procedure was very painful. She is requesting a definitive solution. I discussed with her that I'm happy to open this up and drain it again. I would place packing and hope that it would involute and heal from the inside out; however, I cannot promise complete resolution. Patient is very tearful at the prospect of multiple procedures. She declined a second incision and drainage. I did offer her short course of pain medication. I have instructed her to use sitz baths. She does now have a primary physician and [facility] but if she has any new or worsening symptoms  including worsening pain, enlargement of the cyst, or overlying skin changes she needs to be seen immediately. I encouraged [facility] to [facility] if this happens.  After history, exam, and medical workup I feel the patient has been appropriately medically screened and is safe for discharge home. Pertinent diagnoses were discussed with the patient. Patient was given return precautions.   Final Clinical Impressions(s) / ED Diagnoses   Final diagnoses:  Bartholin's gland infection    New Prescriptions New Prescriptions   HYDROCODONE-ACETAMINOPHEN (NORCO/VICODIN) 5-325 MG TABLET    Take 1 tablet by mouth every 6 (six) hours as needed.   ONDANSETRON (ZOFRAN ODT) 4 MG DISINTEGRATING TABLET    Take 1 tablet (4 mg total) by mouth every 8 (eight) hours as needed for nausea or vomiting.   I personally performed the services described in this documentation, which was scribed in my presence. The recorded information has been reviewed and is accurate.    Shon Batonourtney F Horton, MD 07/05/16 [phone]

## 2016-07-05 NOTE — Discharge Instructions (Signed)
If your symptoms are not controlled with conservative measures, [facility]

## 2016-08-24 ENCOUNTER — Inpatient Hospital Stay (HOSPITAL_COMMUNITY): Payer: Medicaid Other

## 2016-08-24 ENCOUNTER — Inpatient Hospital Stay (HOSPITAL_COMMUNITY)
Admission: AD | Admit: 2016-08-24 | Discharge: 2016-08-24 | Disposition: A | Discharge status: Home / Self Care | Payer: Medicaid Other | Source: Ambulatory Visit | Attending: Obstetrics & Gynecology | Admitting: Obstetrics & Gynecology

## 2016-08-24 ENCOUNTER — Encounter (HOSPITAL_COMMUNITY): Payer: Self-pay | Admitting: *Deleted

## 2016-08-24 DIAGNOSIS — R109 Unspecified abdominal pain: Secondary | ICD-10-CM

## 2016-08-24 DIAGNOSIS — O209 Hemorrhage in early pregnancy, unspecified: Secondary | ICD-10-CM | POA: Insufficient documentation

## 2016-08-24 DIAGNOSIS — O9989 Other specified diseases and conditions complicating pregnancy, childbirth and the puerperium: Secondary | ICD-10-CM

## 2016-08-24 DIAGNOSIS — Z87891 Personal history of nicotine dependence: Secondary | ICD-10-CM | POA: Insufficient documentation

## 2016-08-24 DIAGNOSIS — Z3A01 Less than 8 weeks gestation of pregnancy: Secondary | ICD-10-CM | POA: Insufficient documentation

## 2016-08-24 DIAGNOSIS — O3680X0 Pregnancy with inconclusive fetal viability, not applicable or unspecified: Secondary | ICD-10-CM

## 2016-08-24 LAB — WET PREP, GENITAL
CLUE CELLS WET PREP: NONE SEEN
Sperm: NONE SEEN
Trich, Wet Prep: NONE SEEN
Yeast Wet Prep HPF POC: NONE SEEN

## 2016-08-24 LAB — URINALYSIS, ROUTINE W REFLEX MICROSCOPIC
Bilirubin Urine: NEGATIVE
GLUCOSE, UA: NEGATIVE mg/dL
HGB URINE DIPSTICK: NEGATIVE
Ketones, ur: NEGATIVE mg/dL
Leukocytes, UA: NEGATIVE
Nitrite: NEGATIVE
Protein, ur: NEGATIVE mg/dL
Specific Gravity, Urine: 1.008 (ref 1.005–1.030)
pH: 6 (ref 5.0–8.0)

## 2016-08-24 LAB — POCT PREGNANCY, URINE: PREG TEST UR: POSITIVE — AB

## 2016-08-24 LAB — CBC
HCT: 36.3 % (ref 36.0–46.0)
Hemoglobin: 12.7 g/dL (ref 12.0–15.0)
MCH: 31.8 pg (ref 26.0–34.0)
MCHC: 35 g/dL (ref 30.0–36.0)
MCV: 90.8 fL (ref 78.0–100.0)
Platelets: 228 10*3/uL (ref 150–400)
RBC: 4 MIL/uL (ref 3.87–5.11)
RDW: 13.8 % (ref 11.5–15.5)
WBC: 6.7 10*3/uL (ref 4.0–10.5)

## 2016-08-24 LAB — HCG, QUANTITATIVE, PREGNANCY: HCG, BETA CHAIN, QUANT, S: 652 m[IU]/mL — AB (ref <5)

## 2016-08-24 NOTE — MAU Note (Signed)
PT  SAYS SHE STARTED  VAG  BLEEDING  AT 6PM- - BLOOD  WAS IN HER UNDERWEAR  AND  WHEN  SHE  WIPED . IN TRIAGE -   NOTHING  ON PANTYLINER.       CRAMPING  STARTED ON WAY  TO HOSPITAL.    DID HPT ON 4-2 AND 4-5  - BOTH  POSITIVE .   NO BIRTH  CONTROL .  LAST SEX- LAST  NIGHT .   HAS  AN APPOINTMENT  ON 4-26   WITH  A DR  .-   FOR   FIRST [facility]  VISIT.

## 2016-08-24 NOTE — Discharge Instructions (Signed)
Ectopic Pregnancy °An ectopic pregnancy is when the fertilized egg attaches (implants) outside the uterus. Most ectopic pregnancies occur in one of the tubes where eggs travel from the ovary to the uterus (fallopian tubes), but the implanting can occur in other locations. In rare cases, ectopic pregnancies occur on the ovary, intestine, pelvis, abdomen, or cervix. In an ectopic pregnancy, the fertilized egg does not have the ability to develop into a normal, healthy baby. °A ruptured ectopic pregnancy is one in which tearing or bursting of a fallopian tube causes internal bleeding. Often, there is intense lower abdominal pain, and vaginal bleeding sometimes occurs. Having an ectopic pregnancy can be life-threatening. If this dangerous condition is not treated, it can lead to blood loss, shock, or even death. °What are the causes? °The most common cause of this condition is damage to one of the fallopian tubes. A fallopian tube may be narrowed or blocked, and that keeps the fertilized egg from reaching the uterus. °What increases the risk? °This condition is more likely to develop in women of childbearing age who have different levels of risk. The levels of risk can be divided into three categories. °High risk  °· You have gone through infertility treatment. °· You have had an ectopic pregnancy before. °· You have had surgery on the fallopian tubes, or another surgical procedure, such as an abortion. °· You have had surgery to have the fallopian tubes tied (tubal ligation). °· You have problems or diseases of the fallopian tubes. °· You have been exposed to diethylstilbestrol (DES). This medicine was used until 1971, and it had effects on babies whose mothers took the medicine. °· You become pregnant while using an IUD (intrauterine device) for birth control. °Moderate risk  °· You have a history of infertility. °· You have had an STI (sexually transmitted infection). °· You have a history of pelvic inflammatory  disease (PID). °· You have scarring from endometriosis. °· You have multiple sexual partners. °· You smoke. °Low risk  °· You have had pelvic surgery. °· You use vaginal douches. °· You became sexually active before age 18. °What are the signs or symptoms? °Common symptoms of this condition include normal pregnancy symptoms, such as missing a period, nausea, tiredness, abdominal pain, breast tenderness, and bleeding. However, ectopic pregnancy will have additional symptoms, such as: °· Pain with intercourse. °· Irregular vaginal bleeding or spotting. °· Cramping or pain on one side or in the lower abdomen. °· Fast heartbeat, low blood pressure, and sweating. °· Passing out while having a bowel movement. °Symptoms of a ruptured ectopic pregnancy and internal bleeding may include: °· Sudden, severe pain in the abdomen and pelvis. °· Dizziness, weakness, light-headedness, or fainting. °· Pain in the shoulder or neck area. °How is this diagnosed? °This condition is diagnosed by: °· A pelvic exam to locate pain or a mass in the abdomen. °· A pregnancy test. This blood test checks for the presence as well as the specific level of pregnancy hormone in the bloodstream. °· Ultrasound. This is performed if a pregnancy test is positive. In this test, a probe is inserted into the vagina. The probe will detect a fetus, possibly in a location other than the uterus. °· Taking a sample of uterus tissue (dilation and curettage, or D&C). °· Surgery to perform a visual exam of the inside of the abdomen using a thin, lighted tube that has a tiny camera on the end (laparoscope). °· Culdocentesis. This procedure involves inserting a needle at the   top of the vagina, behind the uterus. If blood is present in this area, it may indicate that a fallopian tube is torn. °How is this treated? °This condition is treated with medicine or surgery. °Medicine  °· An injection of a medicine (methotrexate) may be given to cause the pregnancy tissue to  be absorbed. This medicine may save your fallopian tube. It may be given if: °¨ The diagnosis is made early, with no signs of active bleeding. °¨ The fallopian tube has not ruptured. °¨ You are considered to be a good candidate for the medicine. °Usually, pregnancy hormone blood levels are checked after methotrexate treatment. This is to be sure that the medicine is effective. It may take 4-6 weeks for the pregnancy to be absorbed. Most pregnancies will be absorbed by 3 weeks. °Surgery  °· A laparoscope may be used to remove the pregnancy tissue. °· If severe internal bleeding occurs, a larger cut (incision) may be made in the lower abdomen (laparotomy) to remove the fetus and placenta. This is done to stop the bleeding. °· Part or all of the fallopian tube may be removed (salpingectomy) along with the fetus and placenta. The fallopian tube may also be repaired during the surgery. °· In very rare circumstances, removal of the uterus (hysterectomy) may be required. °· After surgery, pregnancy hormone testing may be done to be sure that there is no pregnancy tissue left. °Whether your treatment is medicine or surgery, you may receive a Rho (D) immune globulin shot to prevent problems with any future pregnancy. This shot may be given if: °· You are Rh-negative and the baby's father is Rh-positive. °· You are Rh-negative and you do not know the Rh type of the baby's father. °Follow these instructions at home: °· Rest and limit your activity after the procedure for as long as told by your health care provider. °· Until your health care provider says that it is safe: °¨ Do not lift anything that is heavier than 10 lb (4.5 kg), or the limit that your health care provider tells you. °¨ Avoid physical exercise and any movement that requires effort (is strenuous). °· To help prevent constipation: °¨ Eat a healthy diet that includes fruits, vegetables, and whole grains. °¨ Drink 6-8 glasses of water per day. °Get help right  away if: °· You develop worsening pain that is not relieved by medicine. °· You have: °¨ A fever or chills. °¨ Vaginal bleeding. °¨ Redness and swelling at the incision site. °¨ Nausea and vomiting. °· You feel dizzy or weak. °· You feel light-headed or you faint. °This information is not intended to replace advice given to you by your health care provider. Make sure you discuss any questions you have with your health care provider. °Document Released: 06/08/2004 Document Revised: 12/29/2015 Document Reviewed: 12/01/2015 °Elsevier Interactive Patient Education © 2017 [employer]. ° °

## 2016-08-24 NOTE — MAU Provider Note (Signed)
History     CSN: [id number]  Arrival date and time: 08/24/16 2000   First Provider Initiated Contact with Patient 08/24/16 2034      Chief Complaint  Patient presents with  . Vaginal Bleeding   Vaginal Bleeding  The patient's primary symptoms include pelvic pain and vaginal bleeding. This is a new problem. The current episode started yesterday. The problem occurs intermittently. The problem has been unchanged. Pain severity now: 2-3/10. The problem affects both sides. She is pregnant. Pertinent negatives include no chills, dysuria, fever, frequency, nausea, urgency or vomiting. The vaginal discharge was bloody. The vaginal bleeding is lighter than menses. She has not been passing clots. She has not been passing tissue. The symptoms are aggravated by intercourse. She has tried nothing for the symptoms. She is sexually active. She uses nothing for contraception. Her menstrual history has been regular (LMP 07/16/16 approx. ).   Past Medical History:  Diagnosis Date  . Bartholin cyst     Past Surgical History:  Procedure Laterality Date  . BREAST SURGERY     breast augmentation  . CESAREAN SECTION N/A    2 c-sections  . CHOLECYSTECTOMY      History reviewed. No pertinent family history.  Social History  Substance Use Topics  . Smoking status: Former Games developer  . Smokeless tobacco: Never Used  . Alcohol use Yes     Comment: occ    Allergies: No Known Allergies  Prescriptions Prior to Admission  Medication Sig Dispense Refill Last Dose  . HYDROcodone-acetaminophen (NORCO/VICODIN) 5-325 MG tablet Take 1 tablet by mouth every 6 (six) hours as needed. (Patient not taking: Reported on 08/24/2016) 10 tablet 0 Completed Course at Unknown time  . ondansetron (ZOFRAN ODT) 4 MG disintegrating tablet Take 1 tablet (4 mg total) by mouth every 8 (eight) hours as needed for nausea or vomiting. (Patient not taking: Reported on 08/24/2016) 20 tablet 0 Not Taking at Unknown time    Review of  Systems  Constitutional: Negative for chills and fever.  Gastrointestinal: Negative for nausea and vomiting.  Genitourinary: Positive for pelvic pain and vaginal bleeding. Negative for dysuria, frequency and urgency.  Musculoskeletal:       Cramps radiate down legs, into thighs.    Physical Exam   Blood pressure 107/60, pulse 66, temperature 98.3 F (36.8 C), temperature source Oral, resp. rate 18, height  (1.549 m), weight 67.9 kg (149 lb 12 oz), last menstrual period 07/16/2016.  Physical Exam  Nursing note and vitals reviewed. Constitutional: She is oriented to person, place, and time. She appears well-developed and well-nourished. No distress.  HENT:  Head: Normocephalic.  Cardiovascular: Normal rate.   Respiratory: Effort normal.  GI: Soft. There is no tenderness. There is no rebound.  Neurological: She is alert and oriented to person, place, and time.  Skin: Skin is warm and dry.  Psychiatric: She has a normal mood and affect.     Results for orders placed or performed during the hospital encounter of 08/24/16 (from the past 24 hour(s))  Urinalysis, Routine w reflex microscopic     Status: Abnormal   Collection Time: 08/24/16  8:18 PM  Result Value Ref Range   Color, Urine STRAW (A) YELLOW   APPearance CLEAR CLEAR   Specific Gravity, Urine 1.008 1.005 - 1.030   pH 6.0 5.0 - 8.0   Glucose, UA NEGATIVE NEGATIVE mg/dL   Hgb urine dipstick NEGATIVE NEGATIVE   Bilirubin Urine NEGATIVE NEGATIVE   Ketones, ur NEGATIVE NEGATIVE mg/dL  Protein, ur NEGATIVE NEGATIVE mg/dL   Nitrite NEGATIVE NEGATIVE   Leukocytes, UA NEGATIVE NEGATIVE  Pregnancy, urine POC     Status: Abnormal   Collection Time: 08/24/16  8:26 PM  Result Value Ref Range   Preg Test, Ur POSITIVE (A) NEGATIVE  Wet prep, genital     Status: Abnormal   Collection Time: 08/24/16  8:40 PM  Result Value Ref Range   Yeast Wet Prep HPF POC NONE SEEN NONE SEEN   Trich, Wet Prep NONE SEEN NONE SEEN   Clue Cells  Wet Prep HPF POC NONE SEEN NONE SEEN   WBC, Wet Prep HPF POC FEW (A) NONE SEEN   Sperm NONE SEEN   CBC     Status: None   Collection Time: 08/24/16  8:41 PM  Result Value Ref Range   WBC 6.7 4.0 - 10.5 K/uL   RBC 4.00 3.87 - 5.11 MIL/uL   Hemoglobin 12.7 12.0 - 15.0 g/dL   HCT 11.9 14.7 - 82.9 %   MCV 90.8 78.0 - 100.0 fL   MCH 31.8 26.0 - 34.0 pg   MCHC 35.0 30.0 - 36.0 g/dL   RDW 56.2 13.0 - 86.5 %   Platelets 228 150 - 400 K/uL  ABO/Rh     Status: None (Preliminary result)   Collection Time: 08/24/16  8:41 PM  Result Value Ref Range   ABO/RH(D) A POS   hCG, quantitative, pregnancy     Status: Abnormal   Collection Time: 08/24/16  8:41 PM  Result Value Ref Range   hCG, Beta Chain, Quant, S 652 (H) <5 mIU/mL   US Ob Comp Less 14 Wks  Result Date: 08/24/2016 CLINICAL DATA:  Acute onset of vaginal bleeding.  Initial encounter. EXAM: OBSTETRIC <14 WK [place] AND TRANSVAGINAL OB US TECHNIQUE: Both transabdominal and transvaginal ultrasound examinations were performed for complete evaluation of the gestation as well as the maternal uterus, adnexal regions, and pelvic cul-de-sac. Transvaginal technique was performed to assess early pregnancy. COMPARISON:  None. FINDINGS: Intrauterine gestational sac: Single; somewhat flattened, with mild internal echogenicity. Yolk sac:  No Embryo:  No Cardiac Activity: N/A MSD: 1.03 cm   5 w   5  d Subchorionic hemorrhage:  None visualized. Maternal uterus/adnexae: The uterus otherwise unremarkable in appearance. The right ovary is unremarkable. The right ovary measures 3.0 x 1.6 x 1.8 cm, while the left ovary measures 5.5 x 3.0 x 4.3 cm. A 3.5 cm left ovarian cyst is noted. A small amount of free fluid is seen within the pelvic cul-de-sac. IMPRESSION: Single somewhat flattened intrauterine gestational sac noted, with a mean sac diameter of 1.0 cm. Mild internal echogenicity noted. No yolk sac or embryo seen at this time. If the patient's quantitative beta HCG  continues to rise, follow-up pelvic ultrasound could be performed in 2 weeks for further evaluation. The mean sac diameter corresponds to a gestational age of [redacted] weeks 5 days, which matches the gestational age of [redacted] weeks 4 days by LMP, reflecting an estimated date of delivery of April 22, 2017. Electronically Signed   By: Roanna Raider M.D.   On: 08/24/2016 22:08   US Ob Transvaginal  Result Date: 08/24/2016 CLINICAL DATA:  Acute onset of vaginal bleeding.  Initial encounter. EXAM: OBSTETRIC <14 WK [place] AND TRANSVAGINAL OB US TECHNIQUE: Both transabdominal and transvaginal ultrasound examinations were performed for complete evaluation of the gestation as well as the maternal uterus, adnexal regions, and pelvic cul-de-sac. Transvaginal technique was performed to assess early  pregnancy. COMPARISON:  None. FINDINGS: Intrauterine gestational sac: Single; somewhat flattened, with mild internal echogenicity. Yolk sac:  No Embryo:  No Cardiac Activity: N/A MSD: 1.03 cm   5 w   5  d Subchorionic hemorrhage:  None visualized. Maternal uterus/adnexae: The uterus otherwise unremarkable in appearance. The right ovary is unremarkable. The right ovary measures 3.0 x 1.6 x 1.8 cm, while the left ovary measures 5.5 x 3.0 x 4.3 cm. A 3.5 cm left ovarian cyst is noted. A small amount of free fluid is seen within the pelvic cul-de-sac. IMPRESSION: Single somewhat flattened intrauterine gestational sac noted, with a mean sac diameter of 1.0 cm. Mild internal echogenicity noted. No yolk sac or embryo seen at this time. If the patient's quantitative beta HCG continues to rise, follow-up pelvic ultrasound could be performed in 2 weeks for further evaluation. The mean sac diameter corresponds to a gestational age of [redacted] weeks 5 days, which matches the gestational age of [redacted] weeks 4 days by LMP, reflecting an estimated date of delivery of April 22, 2017. Electronically Signed   By: Roanna Raider M.D.   On: 08/24/2016 22:08     MAU  Course  Procedures  MDM  Assessment and Plan   1. Pregnancy of unknown anatomic location   2. Vaginal bleeding in pregnancy, first trimester    DC home Comfort measures reviewed  1st Trimester precautions  Bleeding precautions Ectopic precautions RX: none  Return to MAU as needed   Follow-up Information    [facility] [place] [facility] ADMISSIONS Follow up.   Why:  Saturday night (around 8:00 pm) or you can come Sunday morning  Contact information: [address] [id number] mc [place] [address] [phone]           Tawnya Crook 08/24/2016, 8:34 PM

## 2016-08-25 LAB — GC/CHLAMYDIA PROBE AMP (~~LOC~~) NOT AT ARMC
CHLAMYDIA, DNA PROBE: NEGATIVE
NEISSERIA GONORRHEA: NEGATIVE

## 2016-08-25 LAB — ABO/RH: ABO/RH(D): A POS

## 2016-08-26 LAB — HIV ANTIBODY (ROUTINE TESTING W REFLEX): HIV SCREEN 4TH GENERATION: NONREACTIVE

## 2016-08-26 LAB — RPR: RPR Ser Ql: NONREACTIVE

## 2016-08-27 ENCOUNTER — Inpatient Hospital Stay (HOSPITAL_COMMUNITY)
Admission: AD | Admit: 2016-08-27 | Discharge: 2016-08-27 | Disposition: A | Discharge status: Home / Self Care | Payer: Medicaid Other | Source: Ambulatory Visit | Attending: Family Medicine | Admitting: Family Medicine

## 2016-08-27 DIAGNOSIS — Z3A01 Less than 8 weeks gestation of pregnancy: Secondary | ICD-10-CM | POA: Diagnosis not present

## 2016-08-27 DIAGNOSIS — Z3201 Encounter for pregnancy test, result positive: Secondary | ICD-10-CM | POA: Diagnosis not present

## 2016-08-27 DIAGNOSIS — O209 Hemorrhage in early pregnancy, unspecified: Secondary | ICD-10-CM | POA: Diagnosis present

## 2016-08-27 DIAGNOSIS — O039 Complete or unspecified spontaneous abortion without complication: Secondary | ICD-10-CM | POA: Insufficient documentation

## 2016-08-27 LAB — HCG, QUANTITATIVE, PREGNANCY: HCG, BETA CHAIN, QUANT, S: 159 m[IU]/mL — AB (ref <5)

## 2016-08-27 NOTE — MAU Note (Signed)
Venia Carbon NP in Triage to discuss test results and plan of care. Pt d/c home from Triage

## 2016-08-27 NOTE — Progress Notes (Signed)
Written and verbal d/c instructions given and understanding voiced. 

## 2016-08-27 NOTE — MAU Provider Note (Signed)
S:   Ann Allen is a 31 y.o. female [phone] @ Unknown here in MAU for a follow up quant. She was seen on 4/12 with vaginal bleeding. She was instructed to return yesterday for a quant. She denies pain, however continues to have light vaginal bleeding.    O:  GENERAL: Well-developed, well-nourished female in no acute distress.  LUNGS: Effort normal SKIN: Warm, dry and without erythema PSYCH: Normal mood and affect  Vitals:   08/27/16 0106  BP: 111/62  Pulse: 79  Resp: 16  Temp: 98.5 F (36.9 C)    Results for orders placed or performed during the hospital encounter of 08/27/16 (from the past 24 hour(s))  hCG, quantitative, pregnancy     Status: Abnormal   Collection Time: 08/27/16  1:13 AM  Result Value Ref Range   hCG, Beta Chain, Quant, S 159 (H) <5 mIU/mL    MDM:  Quant 4/12: 652 Quant today 159 A positive blood type    A:  SAB (spontaneous abortion)   P:  Discharge home in stable condition Follow up in 1-2 week in the WOC for quant. Message sent Return to MAU if symptoms worse  Pelvic rest Bleeding precautions Support given.    Duane Lope, NP 08/27/2016 2:13 AM

## 2016-08-27 NOTE — Discharge Instructions (Signed)

## 2016-08-27 NOTE — MAU Note (Signed)
Here for repeat BHCG. Still having spotting which is no different than when here on 12th. No cramping. "I don't feel pregnant except breast still sore"

## 2016-09-04 ENCOUNTER — Other Ambulatory Visit: Payer: Medicaid Other

## 2016-09-04 DIAGNOSIS — O039 Complete or unspecified spontaneous abortion without complication: Secondary | ICD-10-CM

## 2016-09-05 LAB — BETA HCG QUANT (REF LAB): hCG Quant: 2 m[IU]/mL

## 2016-09-07 ENCOUNTER — Encounter: Payer: Medicaid Other | Admitting: Family Medicine

## 2016-09-07 ENCOUNTER — Telehealth: Payer: Self-pay

## 2016-09-07 ENCOUNTER — Emergency Department (HOSPITAL_BASED_OUTPATIENT_CLINIC_OR_DEPARTMENT_OTHER)
Admission: EM | Admit: 2016-09-07 | Discharge: 2016-09-07 | Disposition: A | Discharge status: Home / Self Care | Payer: Medicaid Other | Attending: Emergency Medicine | Admitting: Emergency Medicine

## 2016-09-07 DIAGNOSIS — N760 Acute vaginitis: Secondary | ICD-10-CM | POA: Insufficient documentation

## 2016-09-07 DIAGNOSIS — Z87891 Personal history of nicotine dependence: Secondary | ICD-10-CM | POA: Insufficient documentation

## 2016-09-07 DIAGNOSIS — B9689 Other specified bacterial agents as the cause of diseases classified elsewhere: Secondary | ICD-10-CM

## 2016-09-07 DIAGNOSIS — N898 Other specified noninflammatory disorders of vagina: Secondary | ICD-10-CM | POA: Diagnosis present

## 2016-09-07 LAB — URINALYSIS, ROUTINE W REFLEX MICROSCOPIC
BILIRUBIN URINE: NEGATIVE
Glucose, UA: NEGATIVE mg/dL
HGB URINE DIPSTICK: NEGATIVE
KETONES UR: NEGATIVE mg/dL
Leukocytes, UA: NEGATIVE
Nitrite: NEGATIVE
Protein, ur: NEGATIVE mg/dL
SPECIFIC GRAVITY, URINE: 1.003 — AB (ref 1.005–1.030)
pH: 6.5 (ref 5.0–8.0)

## 2016-09-07 LAB — WET PREP, GENITAL
Sperm: NONE SEEN
Trich, Wet Prep: NONE SEEN
Yeast Wet Prep HPF POC: NONE SEEN

## 2016-09-07 LAB — PREGNANCY, URINE: Preg Test, Ur: NEGATIVE

## 2016-09-07 MED ORDER — METRONIDAZOLE 500 MG PO TABS: 500.0000 mg | ORAL_TABLET | Freq: Two times a day (BID) | ORAL | 0 refills | Status: DC

## 2016-09-07 MED ORDER — METRONIDAZOLE 500 MG PO TABS
500.0000 mg | ORAL_TABLET | Freq: Once | ORAL | Status: AC
  Administered 2016-09-07: 500 mg via ORAL
  Filled 2016-09-07: qty 1

## 2016-09-07 MED FILL — metroNIDAZOLE 500 MG TABS: 500 | 7 days supply | Qty: 14 | Fill #0

## 2016-09-07 NOTE — ED Triage Notes (Signed)
Miscarriage x 2 weeks ago    Vag discharge since  With "fowl  Odor"

## 2016-09-07 NOTE — Telephone Encounter (Signed)
Patient recently had a miscarriage and was to come to our office today 09-07-16 for a new ob appointment. Patient cancelled appointment but now would like follow up from her miscarriage. Patient give appointment 09-13-16. Armandina Stammer RNBSN

## 2016-09-07 NOTE — ED Provider Notes (Signed)
[facility] Provider Note   CSN: [id number] Arrival date & time: 09/07/16  1109     History   Chief Complaint Chief Complaint  Patient presents with  . Vaginal Discharge    HPI Ann Allen is a 31 y.o. G33P2A6 female who presents today with chief complaint sudden onset, progressively worsening vaginal odor and discharge. She states she experienced a miscarriage 2 weeks ago and noticed a "foul, fishy odor "since then. she states the odor improved but then acutely worsened 3 days ago with the addition of yellow-white vaginal discharge. She denies vaginal pain, itching, new sexual partners, and states that vaginal bleeding after the miscarriage stopped over 1 week ago. She has been followed by OB/GYN to trend her beta hCG levels, and found to be downtrending 3 days ago. She states she has had BV frequently in the past, and was even put on a medication to try to stop her recurrent BV. She states her current symptoms are very similar to that. She is interested in having a full STD workup including HIV and syphilis testing.  Denies fevers, chills, abd pain, n/v/d, cp/sob, dysuria, hematuria, melena.   The history is provided by the patient.    Past Medical History:  Diagnosis Date  . Bartholin cyst     There are no active problems to display for this patient.   Past Surgical History:  Procedure Laterality Date  . BREAST SURGERY     breast augmentation  . CESAREAN SECTION N/A    2 c-sections  . CHOLECYSTECTOMY      OB History    Gravida Para Term Preterm AB Living   SAB TAB Ectopic Multiple Live Births   Home Medications    Prior to Admission medications   Medication Sig Start Date End Date Taking? Authorizing Provider  metroNIDAZOLE (FLAGYL) 500 MG tablet Take 1 tablet (500 mg total) by mouth 2 (two) times daily. 09/07/16   Vanetta Mulders, MD    Family History No family history on file.  Social History Social History    Substance Use Topics  . Smoking status: Former Games developer  . Smokeless tobacco: Never Used  . Alcohol use Yes     Comment: occ     Allergies   Patient has no known allergies.   Review of Systems Review of Systems  Constitutional: Negative for chills and fever.  Respiratory: Negative for shortness of breath.   Cardiovascular: Negative for chest pain.  Gastrointestinal: Negative for abdominal pain, blood in stool, diarrhea, nausea and vomiting.  Genitourinary: Positive for vaginal discharge. Negative for dysuria, hematuria, pelvic pain, vaginal bleeding and vaginal pain.  Neurological: Negative for dizziness and headaches.     Physical Exam Updated Vital Signs BP 114/72 (BP Location: Left Arm)   Pulse 70   Temp 98.5 F (36.9 C) (Oral)   Resp 16   Ht  (1.549 m)   Wt 71.7 kg   LMP 07/16/2016   SpO2 100%   BMI 29.85 kg/m   Physical Exam  Constitutional: She appears well-developed and well-nourished. No distress.  HENT:  Head: Normocephalic and atraumatic.  Eyes: Conjunctivae are normal.  Neck: Neck supple. No JVD present. No tracheal deviation present.  Cardiovascular: Normal rate, regular rhythm and normal heart sounds.   No murmur heard. Pulmonary/Chest: Effort normal and breath sounds normal. No respiratory distress.  Abdominal: Soft. Bowel  sounds are normal. She exhibits no distension. There is no tenderness. There is no guarding.  Genitourinary:  Genitourinary Comments: Examination performed in the presence of chaperone. Cervical os closed with no bleeding, purulence, or other drainage. Scant yellow-white discharge. Vaginal wall without abnormalities. No cervical motion tenderness or adnexal tenderness. Uterus symmetrically round and firm  Musculoskeletal: She exhibits no edema.  Neurological: She is alert.  Skin: Skin is warm and dry.  Psychiatric: She has a normal mood and affect. Her behavior is normal.  Nursing note and vitals reviewed.    ED Treatments  / Results  Labs (all labs ordered are listed, but only abnormal results are displayed) Labs Reviewed  WET PREP, GENITAL - Abnormal; Notable for the following:       Result Value   Clue Cells Wet Prep HPF POC PRESENT (*)    WBC, Wet Prep HPF POC MODERATE (*)    All other components within normal limits  URINALYSIS, ROUTINE W REFLEX MICROSCOPIC - Abnormal; Notable for the following:    Specific Gravity, Urine 1.003 (*)    All other components within normal limits  PREGNANCY, URINE  RPR  HIV ANTIBODY (ROUTINE TESTING)  GC/CHLAMYDIA PROBE AMP ([place]) NOT AT [facility]    EKG  EKG Interpretation None       Radiology No results found.  Procedures Procedures (including critical care time)  Medications Ordered in ED Medications  metroNIDAZOLE (FLAGYL) tablet 500 mg (500 mg Oral Given 09/07/16 1257)     Initial Impression / Assessment and Plan / ED Course  I have reviewed the triage vital signs and the nursing notes.  Pertinent labs & imaging results that were available during my care of the patient were reviewed by me and considered in my medical decision making (see chart for details).     Patient two-week status post miscarriage presents with 3 day history of vaginal odor and discharge. History of recurrent BV infections. Patient is afebrile, with stable vital signs and denies vaginal pain, bleeding, abdominal pain. Abdominal exam unremarkable. Urine pregnancy negative. UA unremarkable for UTI. Pelvic examination shows some yellow-white vaginal discharge without bleeding or tenderness. Low suspicion for PID, TOA, ovarian torsion. Wet prep with clue cells and WBCs, consistent with BV. Remainder of results still pending, patient will be informed if any of her STI workup comes back positive. First dose Flagyl given in ED. Rx for remainder given. Recommend follow-up with OB/GYN for reevaluation. Discussed strict ED return precautions. Pt verbalized understanding of and agreement with  plan and is safe for discharge home at this time.    Final Clinical Impressions(s) / ED Diagnoses   Final diagnoses:  BV (bacterial vaginosis)    New Prescriptions Discharge Medication List as of 09/07/2016  1:49 PM    START taking these medications   Details  metroNIDAZOLE (FLAGYL) 500 MG tablet Take 1 tablet (500 mg total) by mouth 2 (two) times daily., Starting Thu 09/07/2016, Print         Jeanie Sewer, PA-C 09/07/16 1717    Vanetta Mulders, MD 09/14/16 2103

## 2016-09-07 NOTE — Discharge Instructions (Signed)
Please take all of your antibiotics until finished!   You may develop abdominal discomfort or diarrhea from the antibiotic.  You may help offset this with probiotics which you can buy or get in yogurt. Do not eat  or take the probiotics until 2 hours after your antibiotic.   

## 2016-09-08 LAB — GC/CHLAMYDIA PROBE AMP (~~LOC~~) NOT AT ARMC
Chlamydia: NEGATIVE
Neisseria Gonorrhea: NEGATIVE

## 2016-09-08 LAB — RPR: RPR Ser Ql: NONREACTIVE

## 2016-09-08 LAB — HIV ANTIBODY (ROUTINE TESTING W REFLEX): HIV Screen 4th Generation wRfx: NONREACTIVE

## 2016-09-13 ENCOUNTER — Encounter: Payer: Self-pay | Admitting: Obstetrics & Gynecology

## 2016-09-13 ENCOUNTER — Ambulatory Visit (INDEPENDENT_AMBULATORY_CARE_PROVIDER_SITE_OTHER): Payer: Medicaid Other | Admitting: Obstetrics & Gynecology

## 2016-09-13 VITALS — BP 108/65 | HR 78 | Wt 153.0 lb

## 2016-09-13 DIAGNOSIS — O039 Complete or unspecified spontaneous abortion without complication: Secondary | ICD-10-CM

## 2016-09-13 DIAGNOSIS — N751 Abscess of Bartholin's gland: Secondary | ICD-10-CM | POA: Diagnosis not present

## 2016-09-13 DIAGNOSIS — F329 Major depressive disorder, single episode, unspecified: Secondary | ICD-10-CM | POA: Diagnosis not present

## 2016-09-13 DIAGNOSIS — F32A Depression, unspecified: Secondary | ICD-10-CM

## 2016-09-13 NOTE — Progress Notes (Signed)
Follow up SAB.Patient complaining of Bartholins gland. Patient has had one previously in Jan. Patient requesting something for her mood.(patient tearful when requesting this and states "I just dont feel like myself").Patient scored 19 on the PHQ-9 depression screening tool. Armandina Stammer RNBSN

## 2016-09-13 NOTE — Patient Instructions (Signed)
Bartholin Cyst or Abscess A Bartholin cyst is a fluid-filled sac that forms on a Bartholin gland. Bartholin glands are small glands that are located within the folds of skin (labia) along the sides of the lower opening of the vagina. These glands produce a fluid to moisten the outside of the vagina during sexual intercourse. A Bartholin cyst causes a bulge on the side of the vagina. A cyst that is not large or infected may not cause symptoms or problems. However, if the fluid within the cyst becomes infected, the cyst can turn into an abscess. An abscess may cause discomfort or pain. What are the causes? A Bartholin cyst may develop when the duct of the gland becomes blocked. In many cases, the cause of this is not known. Various kinds of bacteria can cause the cyst to become infected and develop into an abscess. What increases the risk? You may be at an increased risk of developing a Bartholin cyst or abscess if:  You are a woman of reproductive age.  You have a history of previous Bartholin cysts or abscesses.  You have diabetes.  You have a sexually transmitted disease (STD). What are the signs or symptoms? The severity of symptoms varies depending on the size of the cyst and whether it is infected. Symptoms may include:  A bulge or swelling near the lower opening of your vagina.  Discomfort or pain.  Redness.  Pain during sexual intercourse.  Pain when walking.  Fluid draining from the area. How is this diagnosed? Your health care provider may make a diagnosis based on your symptoms and a physical exam. He or she will look for swelling in your vaginal area. Blood tests may be done to check for infections. A sample of fluid from the cyst or abscess may also be taken to be tested in a lab. How is this treated? Small cysts that are not infected may not require any treatment. These often go away on their own. Yourhealth care provider will recommend hot baths and the use of warm  compresses. These may also be part of the treatment for an abscess. Treatment options for a large cyst or abscess may include:  Antibiotic medicine.  A surgical procedure to drain the abscess. One of the following procedures may be done:  Incision and drainage. An incision is made in the cyst or abscess so that the fluid drains out. A catheter may be placed inside the cyst so that it does not close and fill up with fluid again. The catheter will be removed after you have a follow-up visit with a specialist (gynecologist).  Marsupialization. The cyst or abscess is opened and kept open by stitching the edges of the skin to the walls of the cyst or abscess. This allows it to continue to drain and not fill up with fluid again. If you have cysts or abscesses that keep returning and have required incision and drainage multiple times, your health care provider may talk to you about surgery to remove the Bartholin gland. Follow these instructions at home:  Take medicines only as directed by your health care provider.  If you were prescribed an antibiotic medicine, finish it all even if you start to feel better.  Apply warm, wet compresses to the area or take warm, shallow baths that cover your pelvic region (sitz baths) several times a day or as directed by your health care provider.  Do not squeeze the cyst or apply heavy pressure to it.  Do not have   sexual intercourse until the cyst has gone away.  If your cyst or abscess was opened, a small piece of gauze or a drain may have been placed in the area to allow drainage. Do not remove the gauze or the drain until directed by your health care provider.  Wear feminine pads-not tampons-as needed for any drainage or bleeding.  Keep all follow-up visits as directed by your health care provider. This is important. How is this prevented? Take these steps to help prevent a Bartholin cyst from returning:  Practice good hygiene.  Clean your vaginal area  with mild soap and a soft cloth when you bathe.  Practice safe sex to prevent STDs. Contact a health care provider if:  You have increased pain, swelling, or redness in the area of the cyst.  Puslike drainage is coming from the cyst.  You have a fever. This information is not intended to replace advice given to you by your health care provider. Make sure you discuss any questions you have with your health care provider. Document Released: 05/01/2005 Document Revised: 10/07/2015 Document Reviewed: 12/15/2013 Elsevier Interactive Patient Education  2017 Elsevier Inc.  

## 2016-09-13 NOTE — Progress Notes (Signed)
History:  31 y.o. [id number] here today for SAB. Pt was seen in the MAU for bleeding in the first trimester. She subsequently passed the POC 2 days after her MAU visit. She reports that she has a new onset of a Bartholin's gland abscess. She reports that she had one prev and had a Word catheter that feel out almost as soon as she left the hosp.  The following portions of the patient's history were reviewed and updated as appropriate: allergies, current medications, past family history, past medical history, past social history, past surgical history and problem list.  Review of Systems:  Pertinent items are noted in HPI.   Objective:  Physical Exam Blood pressure 108/65, pulse 78, weight 153 lb (69.4 kg), last menstrual period 07/16/2016. CONSTITUTIONAL: Well-developed, well-nourished female in no acute distress.  HENT:  Normocephalic, atraumatic EYES: Conjunctivae and EOM are normal. No scleral icterus.  NECK: Normal range of motion SKIN: Skin is warm and dry. No rash noted. Not diaphoretic.No pallor. NEUROLGIC: Alert and oriented to person, place, and time. Normal coordination.  Lungs: CTA CV: RRR Abd: Soft, nontender and nondistended Pelvic: pointing Bartholins on left side of mons pubis  Bartholin Cyst I&D and Word Catheter Placement Enlarged abscess palpated in front of the hymenal ring around 5 or 7 o' clock.  Written informed consent was obtained.  Discussed complications and possible outcomes of procedure including recurrence of cyst, scarring leading to infection, bleeding, dyspareunia, distortion of anatomy.  Patient was examined in the dorsal lithotomy position and mass was identified.  The area was prepped with Iodine and draped in a sterile manner. 1% Lidocaine (3 ml) was then used to infiltrate area on top of the cyst, behind the hymenal ring.  A 7 mm incision was made using a sterile scapel. Upon palpation of the mass, a moderate amount of bloody purulent drainage was expressed  through the incision. A hemostat was used to break up loculations, which resulted in expression of more bloody purulent drainage.  Samples of the drainage were sent for cultures. The open cyst was then copiously irrigated with normal saline, and a Word catheter was placed. 1.5 ml of sterile water was used to inflate the catheter balloon.  The end of the catheter was tucked into the vagina.  Patient tolerated the procedure well, reported feeling "much better."  Labs and Imaging US Ob Comp Less 14 Wks  Result Date: 08/24/2016 CLINICAL DATA:  Acute onset of vaginal bleeding.  Initial encounter. EXAM: OBSTETRIC <14 WK [place] AND TRANSVAGINAL OB US TECHNIQUE: Both transabdominal and transvaginal ultrasound examinations were performed for complete evaluation of the gestation as well as the maternal uterus, adnexal regions, and pelvic cul-de-sac. Transvaginal technique was performed to assess early pregnancy. COMPARISON:  None. FINDINGS: Intrauterine gestational sac: Single; somewhat flattened, with mild internal echogenicity. Yolk sac:  No Embryo:  No Cardiac Activity: N/A MSD: 1.03 cm   5 w   5  d Subchorionic hemorrhage:  None visualized. Maternal uterus/adnexae: The uterus otherwise unremarkable in appearance. The right ovary is unremarkable. The right ovary measures 3.0 x 1.6 x 1.8 cm, while the left ovary measures 5.5 x 3.0 x 4.3 cm. A 3.5 cm left ovarian cyst is noted. A small amount of free fluid is seen within the pelvic cul-de-sac. IMPRESSION: Single somewhat flattened intrauterine gestational sac noted, with a mean sac diameter of 1.0 cm. Mild internal echogenicity noted. No yolk sac or embryo seen at this time. If the patient's quantitative beta HCG continues to  rise, follow-up pelvic ultrasound could be performed in 2 weeks for further evaluation. The mean sac diameter corresponds to a gestational age of [redacted] weeks 5 days, which matches the gestational age of [redacted] weeks 4 days by LMP, reflecting an estimated date  of delivery of April 22, 2017. Electronically Signed   By: Roanna Raider M.D.   On: 08/24/2016 22:08   US Ob Transvaginal  Result Date: 08/24/2016 CLINICAL DATA:  Acute onset of vaginal bleeding.  Initial encounter. EXAM: OBSTETRIC <14 WK [place] AND TRANSVAGINAL OB US TECHNIQUE: Both transabdominal and transvaginal ultrasound examinations were performed for complete evaluation of the gestation as well as the maternal uterus, adnexal regions, and pelvic cul-de-sac. Transvaginal technique was performed to assess early pregnancy. COMPARISON:  None. FINDINGS: Intrauterine gestational sac: Single; somewhat flattened, with mild internal echogenicity. Yolk sac:  No Embryo:  No Cardiac Activity: N/A MSD: 1.03 cm   5 w   5  d Subchorionic hemorrhage:  None visualized. Maternal uterus/adnexae: The uterus otherwise unremarkable in appearance. The right ovary is unremarkable. The right ovary measures 3.0 x 1.6 x 1.8 cm, while the left ovary measures 5.5 x 3.0 x 4.3 cm. A 3.5 cm left ovarian cyst is noted. A small amount of free fluid is seen within the pelvic cul-de-sac. IMPRESSION: Single somewhat flattened intrauterine gestational sac noted, with a mean sac diameter of 1.0 cm. Mild internal echogenicity noted. No yolk sac or embryo seen at this time. If the patient's quantitative beta HCG continues to rise, follow-up pelvic ultrasound could be performed in 2 weeks for further evaluation. The mean sac diameter corresponds to a gestational age of [redacted] weeks 5 days, which matches the gestational age of [redacted] weeks 4 days by LMP, reflecting an estimated date of delivery of April 22, 2017. Electronically Signed   By: Roanna Raider M.D.   On: 08/24/2016 22:08    Assessment & Plan:  +screen for depression and anxiety  Pt scheduled to see [facility] at [facility] in the hosp  Bartholin's gland abscess  Word catheter placed  .     Recommended Sitz baths bid and Motrin and Percocet was given  prn pain.   She was told to call to be  examined if she experiences increasing swelling, pain, vaginal discharge, or fever.  - She was instructed to wear a peripad to absorb discharge, and to maintain pelvic rest while the Word catheter is in place.  -The catheter will be left in place for at least two to four weeks to promote formation of an epithelialized tract for permanent drainage of glandular secretions.  - She will f/u in 2 weeks from now for eval  Boni Maclellan L. Harraway-Smith, M.D., [place]n Core

## 2016-09-14 ENCOUNTER — Inpatient Hospital Stay (HOSPITAL_COMMUNITY)
Admission: AD | Admit: 2016-09-14 | Discharge: 2016-09-14 | Disposition: A | Discharge status: Home / Self Care | Payer: Medicaid Other | Source: Ambulatory Visit | Attending: Obstetrics and Gynecology | Admitting: Obstetrics and Gynecology

## 2016-09-14 ENCOUNTER — Ambulatory Visit (INDEPENDENT_AMBULATORY_CARE_PROVIDER_SITE_OTHER): Payer: Medicaid Other | Admitting: Clinical

## 2016-09-14 ENCOUNTER — Encounter (HOSPITAL_COMMUNITY): Payer: Self-pay | Admitting: *Deleted

## 2016-09-14 ENCOUNTER — Institutional Professional Consult (permissible substitution): Payer: Medicaid Other

## 2016-09-14 DIAGNOSIS — R69 Illness, unspecified: Secondary | ICD-10-CM

## 2016-09-14 DIAGNOSIS — F419 Anxiety disorder, unspecified: Secondary | ICD-10-CM | POA: Insufficient documentation

## 2016-09-14 DIAGNOSIS — F418 Other specified anxiety disorders: Secondary | ICD-10-CM

## 2016-09-14 DIAGNOSIS — N750 Cyst of Bartholin's gland: Secondary | ICD-10-CM | POA: Insufficient documentation

## 2016-09-14 DIAGNOSIS — Z87891 Personal history of nicotine dependence: Secondary | ICD-10-CM | POA: Insufficient documentation

## 2016-09-14 DIAGNOSIS — F329 Major depressive disorder, single episode, unspecified: Secondary | ICD-10-CM | POA: Diagnosis not present

## 2016-09-14 DIAGNOSIS — F3289 Other specified depressive episodes: Secondary | ICD-10-CM | POA: Insufficient documentation

## 2016-09-14 HISTORY — DX: Anxiety disorder, unspecified: F41.9

## 2016-09-14 NOTE — MAU Provider Note (Signed)
Chief Complaint: Anxiety   First Provider Initiated Contact with Patient 09/14/16 1420      SUBJECTIVE HPI: Ann Allen is a 31 y.o. [id number] s/p SAB who presents to maternity admissions reporting feelings of anger and sadness x 1 month that are not improving.  She had follow up visits for her miscarriage at the office in [facility] and was referred to see behavioral health specialist in [facility], which she did today.  [name], LCSWA, recommended outpatient follow up at walk in clinic and gave the patient a list of resources.  [name] reports that she started to go to walk in clinic but was fearful that she would wait a long time and not see an actual doctor to start getting help today.  She also needed money for the bus to get to the clinic it just became too difficult in her current state of mind to overcome her fears and the cost concerns today.  She is tearful in MAU and reports she is not feeling like herself. She is easy to anger with her family, including her children. She reports feeling like she just wants to sleep all the time. She has tried breathing/relaxation exercises and prayer but these are not enough and she desires more help.  She denies any risk to herself or others, denies suicidal thoughts or plans today.  She just wants help as quickly as possible. She denies pain, vaginal bleeding, vaginal itching/burning, urinary symptoms, h/a, dizziness, n/v, or fever/chills.     HPI  Past Medical History:  Diagnosis Date  . Anxiety   . Bartholin cyst    Past Surgical History:  Procedure Laterality Date  . BREAST SURGERY     breast augmentation  . CESAREAN SECTION N/A    2 c-sections  . CHOLECYSTECTOMY     Social History   Social History  . Marital status: Single    Spouse name: N/A  . Number of children: N/A  . Years of education: N/A   Occupational History  . Not on file.   Social History Main Topics  . Smoking status: Former Games developer  . Smokeless tobacco: Never Used   . Alcohol use Yes     Comment: occ  . Drug use: Yes    Frequency: 7.0 times per week    Types: Marijuana     Comment: daily use of marijuana  . Sexual activity: Yes    Birth control/ protection: None   Other Topics Concern  . Not on file   Social History Narrative  . No narrative on file   No current facility-administered medications on file prior to encounter.    No current outpatient prescriptions on file prior to encounter.   No Known Allergies  ROS:  Review of Systems  Constitutional: Positive for appetite change. Negative for chills, fatigue and fever.  Respiratory: Negative for shortness of breath.   Cardiovascular: Negative for chest pain.  Genitourinary: Negative for difficulty urinating, dysuria, flank pain, pelvic pain, vaginal bleeding, vaginal discharge and vaginal pain.  Neurological: Negative for dizziness and headaches.  Psychiatric/Behavioral: Positive for agitation and sleep disturbance. Negative for suicidal ideas. The patient is nervous/anxious.      I have reviewed patient's Past Medical Hx, Surgical Hx, Family Hx, Social Hx, medications and allergies.   Physical Exam   Patient Vitals for the past 24 hrs:  BP Temp Temp src Pulse Resp Height Weight  09/14/16 1348 121/65 97.9 F (36.6 C) Oral 70 18 5\' 1"  (1.549 m) 153  lb (69.4 kg)   Constitutional: Well-developed, well-nourished female in no mild distress.  Cardiovascular: normal rate Respiratory: normal effort GI: Abd soft, non-tender. Pos BS x 4 MS: Extremities nontender, no edema, normal ROM Neurologic: Alert and oriented x 4.  GU: Neg CVAT.   LAB RESULTS No results found for this or any previous visit (from the past 24 hour(s)).  --/--/A POS (04/12 2041)  IMAGING   MAU Management/MDM: Pt having anxiety and depressive symptoms but denies any SI or desire to harm others.  Offered pt telepsych evaluation from MAU vs outpatient management as planned.  She was concerned about walk-in  clinic she was referred to and afraid she would not see provider on her first visit. Called 2 behavioral health providers today and walk-in hours closed for the day, but [facility]. Per [facility] staff, pt will see provider if she walks in tomorrow.  Pt to call crisis line tonight as needed and to go to walk-in clinic first thing tomorrow. Pt comforted by knowledge that she will see a provider on the day of her walk-in. Pt to go to ED or call 911 for emergencies. Pt stable at time of discharge.  ASSESSMENT 1. Other depression   2. Depression with anxiety     PLAN Discharge home [facility]  Allergies as of 09/14/2016   No Known Allergies     Medication List    STOP taking these medications   metroNIDAZOLE 500 MG tablet Commonly known as:  FLAGYL      Follow-up Information    [facility] Follow up.   Specialty:  Behavioral Health Why:  Call to speak with crisis counselor and walk in 8:00 am to 3:00 pm M-F for initial appointment. Return to emergency room as needed if symptoms worsen. Contact information: [address] [place] [address] [phone]           Sharen CounterLisa Leftwich-Kirby Certified Nurse-Midwife 09/14/2016  7:25 PM

## 2016-09-14 NOTE — MAU Note (Signed)
Pt crying, very tearful when I entered the room.  "Please, I need some help.  I'm not feeling right.  I can tell there is something wrong with me.  I don't want to be angry, I don't want to be angry with my kids, I don't want to mess up relationships.  I keep getting sent from place to place.  I'll just leave if you all can't help me."  Encouraged pt to stay & speak with provider to be sure she has been referred to appropriate place.

## 2016-09-14 NOTE — BH Specialist Note (Signed)
Integrated Behavioral Health Initial Visit  MRN: [id number] Name: Ann Allen   Session Start time: 11:30 Session End time: 12:30 Total time: 1 hour  Type of Service: Integrated Behavioral Health- Individual/Family Interpretor:No. Interpretor Name and Language: n/a   Warm Hand Off Completed.       SUBJECTIVE: Ann CheRasheeda Doyel is a 31 y.o. female accompanied by patient. Patient was referred by Dr Erin FullingHarraway-Smith for depression. Patient reports the following symptoms/concerns: Pt states her primary symptoms as feeling depressed, sleep "extremes"(going from sleeping too much to not sleeping at all, for days), feeling bad about herself, lack of concentration, anxiety, excessive worry, irritability, fear of unknown. Pt attempted to cope with BH medications, but cause her to feel increase in depression and gain weight.  Duration of problem: Over two years symptoms, increase since miscarriage two weeks prior; Severity of problem: severe  OBJECTIVE: Mood: Anxious, Depressed and Irritable and Affect: Tearful Risk of harm to self or others: Suicidal ideation No plan to harm self or others   LIFE CONTEXT: Family and Social: Lives with supportive boyfriend and two children School/Work: works Economistfulltime Self-Care: - Life Changes: Recent miscarriage  GOALS ADDRESSED: Patient will reduce symptoms of: agitation, anxiety, depression and mood instability and increase knowledge and/or ability of: self-management skills and also: Decrease self-medicating behaviors   INTERVENTIONS: Motivational Interviewing, Mindfulness or Management consultantelaxation Training and Psychoeducation and/or Health Education  Standardized Assessments completed: GAD-7 and PHQ 2&9 with C-SSRS  ASSESSMENT: Patient currently experiencing Diagnosis deferred.  Patient may benefit from psychoeducation and brief therapeutic interventions regarding coping with symptoms of anxiety and depression, along with contract for safety and referral to  [facility].  PLAN: 1. Follow up with behavioral health clinician on : One day via phone, one week office visit 2. Behavioral recommendations:  -Follow Contract for Safety Plan -[facility], to set up initial appointment -CALM relaxation breathing exercise every morning, to calm the mind; as needed throughout the day -Read educational material regarding coping with symptoms of anxiety and depression  3. Referral(s): Integrated Art gallery managerBehavioral Health Services (In Clinic) and [facility] ([facility]/Outside [facility])  Rae LipsJamie C McMannes, [place]WA   Depression screen [facility] 09/13/2016  Decreased Interest 2  Down, Depressed, Hopeless 3  PHQ - 2 Score 5  Altered sleeping 3  Tired, decreased energy 2  Change in appetite 2  Feeling bad or failure about yourself  3  Trouble concentrating 3  Moving slowly or fidgety/restless 0  Suicidal thoughts 1  PHQ-9 Score 19

## 2016-09-14 NOTE — MAU Note (Signed)
Pt reports she has been feeling depressed and anxiety. Talked with Child psychotherapistocial Worker . [facility]. Came here instead felt like she is getting pushed from one person to another.  Feels hopeless and tired. Does not currently feel suicidal. Staeds she feels if you don't say your suicidal it gets swept under the rug and no one wants to help you.

## 2016-09-14 NOTE — Patient Instructions (Signed)
My Safety Plan:   Step 1: Warning signs (thoughts, images, mood, situation, behavior) that a crisis may be developing: Feeling overwhelming feelings  Step 2: Internal coping strategies: Things I can do to take my mind off my problems without contacting another person (music, relaxation technique, physical activity): Allow self to cry, take one minute walk  Step 3: People and social settings that provide distraction:  Name and contact: Ann EdwardCousin, Ann Allen, [phone]   Step 4: People I can ask for help:  Name, relationship, contact: Boyfriend, Ann Allen, [phone]  Local urgent care services:      1. 9-1-1     2. [facility] (24/7 [facility]-in) [address], [place], [place]     3. [facility]: Intake- [phone]/ [phone]  Suicide Prevention Lifeline Phone: [phone]  Step 5: Making the environment safe- Have friend/family member remove from home: Ann Allen will do this, after taking me to the hospital * Weapons in the home * Medication in the home (including Tylenol)  Step 7: The one thing that is most important to me and worth living for is: My son  Signature of Patient: _________________________________________________  Signature of Provider: ________________________________________________

## 2016-09-15 ENCOUNTER — Telehealth: Payer: Self-pay | Admitting: Clinical

## 2016-09-15 NOTE — Telephone Encounter (Signed)
Attempt to f/u call, as agreed-upon on 09-14-16 between pt and [facility];  no voicemail set up, no message left.

## 2016-09-18 ENCOUNTER — Other Ambulatory Visit (HOSPITAL_COMMUNITY)
Admission: RE | Admit: 2016-09-18 | Discharge: 2016-09-18 | Disposition: A | Discharge status: Home / Self Care | Payer: Medicaid Other | Source: Ambulatory Visit | Attending: Obstetrics & Gynecology | Admitting: Obstetrics & Gynecology

## 2016-09-18 ENCOUNTER — Other Ambulatory Visit: Payer: Medicaid Other

## 2016-09-18 DIAGNOSIS — L298 Other pruritus: Secondary | ICD-10-CM | POA: Diagnosis present

## 2016-09-18 DIAGNOSIS — N898 Other specified noninflammatory disorders of vagina: Secondary | ICD-10-CM

## 2016-09-18 MED ORDER — FLUCONAZOLE 150 MG PO TABS: 150.0000 mg | ORAL_TABLET | Freq: Once | ORAL | 0 refills | Status: AC

## 2016-09-18 NOTE — Progress Notes (Signed)
SUBJECTIVE:  31 y.o. female complains of  vaginal discharge and itching for7 days.. Patient recently had a Batholins gland drained in the office on 09-13-16.   Denies history of known exposure to STD.  Recent SAB  OBJECTIVE:  She appears well, afebrile. Patient called office this morning at 9:15 complaining of intense vaginal itching. Patient showed up at office at 11:48 and self swab given to patient with instructions on how to perform it. Self swab collected. Patient    ASSESSMENT:  Vaginal Discharge   PLAN:  GC, chlamydia, trichomonas, BVAG, CVAG probe sent to lab. Treatment: Per discussion with Dr. Erin FullingHarraway Smith can send in one Diflucan 150 mg for patient to help relieve symptoms. Patient instructed to return to office prn if symptoms persist or worsen. Armandina StammerJennifer Jezabel Lecker RN BSN

## 2016-09-19 ENCOUNTER — Telehealth: Payer: Self-pay | Admitting: Clinical

## 2016-09-19 LAB — CERVICOVAGINAL ANCILLARY ONLY
Bacterial vaginitis: NEGATIVE
CANDIDA VAGINITIS: NEGATIVE
CHLAMYDIA, DNA PROBE: NEGATIVE
NEISSERIA GONORRHEA: NEGATIVE
TRICH (WINDOWPATH): NEGATIVE

## 2016-09-19 NOTE — Telephone Encounter (Signed)
Attempt to f/u with patient; left HIPPA-compliant message [facility] at [phone].

## 2016-09-25 ENCOUNTER — Encounter: Payer: Self-pay | Admitting: Obstetrics & Gynecology

## 2016-09-25 ENCOUNTER — Ambulatory Visit (INDEPENDENT_AMBULATORY_CARE_PROVIDER_SITE_OTHER): Payer: Medicaid Other | Admitting: Obstetrics & Gynecology

## 2016-09-25 VITALS — BP 107/59 | HR 52 | Ht 61.0 in | Wt 151.0 lb

## 2016-09-25 DIAGNOSIS — N751 Abscess of Bartholin's gland: Secondary | ICD-10-CM

## 2016-09-25 NOTE — Progress Notes (Signed)
History:  31 y.o. [id number] here today for f/u of Bartholin's gland abscess that was drained 09/18/2016.  Pt reports tht she could not take the pain so she looked online and read on how to remove it and took it out.  She said that she didn't call first because it was making her anxious and depressed.  She reports that she is going to [facility] today for eval.  She denies pain in the area currently.    The following portions of the patient's history were reviewed and updated as appropriate: allergies, current medications, past family history, past medical history, past social history, past surgical history and problem list.  Review of Systems:  Pertinent items are noted in HPI.   Objective:  Physical Exam Blood pressure (!) 107/59, pulse (!) 52, height 5\' 1"  (1.549 m), weight 151 lb (68.5 kg), unknown if currently breastfeeding.  CONSTITUTIONAL: Well-developed, well-nourished female in no acute distress.  HENT:  Normocephalic, atraumatic EYES: Conjunctivae and EOM are normal. No scleral icterus.  NECK: Normal range of motion SKIN: Skin is warm and dry. No rash noted. Not diaphoretic.No pallor. NEUROLGIC: Alert and oriented to person, place, and time. Normal coordination.  Pelvic: Normal appearing external genitalia; no palpable lesion noted.   Assessment & Plan:  Bartholin's gland abscess-  Counseled pt on NOT squeezing the lesion if it returns but, to use a warm compress   Pt to f/u at [facility]  Total face-to-face time with patient was 10 min.  Greater than 50% was spent in counseling and coordination of care with the patient.   Seerat Peaden L. Harraway-Smith, M.D., Evern CoreFACOG

## 2016-09-25 NOTE — Patient Instructions (Signed)
Bartholin Cyst or Abscess A Bartholin cyst is a fluid-filled sac that forms on a Bartholin gland. Bartholin glands are small glands that are located within the folds of skin (labia) along the sides of the lower opening of the vagina. These glands produce a fluid to moisten the outside of the vagina during sexual intercourse. A Bartholin cyst causes a bulge on the side of the vagina. A cyst that is not large or infected may not cause symptoms or problems. However, if the fluid within the cyst becomes infected, the cyst can turn into an abscess. An abscess may cause discomfort or pain. What are the causes? A Bartholin cyst may develop when the duct of the gland becomes blocked. In many cases, the cause of this is not known. Various kinds of bacteria can cause the cyst to become infected and develop into an abscess. What increases the risk? You may be at an increased risk of developing a Bartholin cyst or abscess if:  You are a woman of reproductive age.  You have a history of previous Bartholin cysts or abscesses.  You have diabetes.  You have a sexually transmitted disease (STD). What are the signs or symptoms? The severity of symptoms varies depending on the size of the cyst and whether it is infected. Symptoms may include:  A bulge or swelling near the lower opening of your vagina.  Discomfort or pain.  Redness.  Pain during sexual intercourse.  Pain when walking.  Fluid draining from the area. How is this diagnosed? Your health care provider may make a diagnosis based on your symptoms and a physical exam. He or she will look for swelling in your vaginal area. Blood tests may be done to check for infections. A sample of fluid from the cyst or abscess may also be taken to be tested in a lab. How is this treated? Small cysts that are not infected may not require any treatment. These often go away on their own. Yourhealth care provider will recommend hot baths and the use of warm  compresses. These may also be part of the treatment for an abscess. Treatment options for a large cyst or abscess may include:  Antibiotic medicine.  A surgical procedure to drain the abscess. One of the following procedures may be done:  Incision and drainage. An incision is made in the cyst or abscess so that the fluid drains out. A catheter may be placed inside the cyst so that it does not close and fill up with fluid again. The catheter will be removed after you have a follow-up visit with a specialist (gynecologist).  Marsupialization. The cyst or abscess is opened and kept open by stitching the edges of the skin to the walls of the cyst or abscess. This allows it to continue to drain and not fill up with fluid again. If you have cysts or abscesses that keep returning and have required incision and drainage multiple times, your health care provider may talk to you about surgery to remove the Bartholin gland. Follow these instructions at home:  Take medicines only as directed by your health care provider.  If you were prescribed an antibiotic medicine, finish it all even if you start to feel better.  Apply warm, wet compresses to the area or take warm, shallow baths that cover your pelvic region (sitz baths) several times a day or as directed by your health care provider.  Do not squeeze the cyst or apply heavy pressure to it.  Do not have   sexual intercourse until the cyst has gone away.  If your cyst or abscess was opened, a small piece of gauze or a drain may have been placed in the area to allow drainage. Do not remove the gauze or the drain until directed by your health care provider.  Wear feminine pads-not tampons-as needed for any drainage or bleeding.  Keep all follow-up visits as directed by your health care provider. This is important. How is this prevented? Take these steps to help prevent a Bartholin cyst from returning:  Practice good hygiene.  Clean your vaginal area  with mild soap and a soft cloth when you bathe.  Practice safe sex to prevent STDs. Contact a health care provider if:  You have increased pain, swelling, or redness in the area of the cyst.  Puslike drainage is coming from the cyst.  You have a fever. This information is not intended to replace advice given to you by your health care provider. Make sure you discuss any questions you have with your health care provider. Document Released: 05/01/2005 Document Revised: 10/07/2015 Document Reviewed: 12/15/2013 Elsevier Interactive Patient Education  2017 Elsevier Inc.  

## 2016-10-18 ENCOUNTER — Ambulatory Visit: Payer: Medicaid Other | Admitting: Family Medicine

## 2016-10-22 ENCOUNTER — Emergency Department (HOSPITAL_BASED_OUTPATIENT_CLINIC_OR_DEPARTMENT_OTHER)
Admission: EM | Admit: 2016-10-22 | Discharge: 2016-10-22 | Disposition: A | Discharge status: Home / Self Care | Payer: Medicaid Other | Attending: Emergency Medicine | Admitting: Emergency Medicine

## 2016-10-22 ENCOUNTER — Encounter: Payer: Self-pay | Admitting: Emergency Medicine

## 2016-10-22 DIAGNOSIS — Z87891 Personal history of nicotine dependence: Secondary | ICD-10-CM | POA: Insufficient documentation

## 2016-10-22 DIAGNOSIS — N898 Other specified noninflammatory disorders of vagina: Secondary | ICD-10-CM | POA: Insufficient documentation

## 2016-10-22 LAB — WET PREP, GENITAL
CLUE CELLS WET PREP: NONE SEEN
Sperm: NONE SEEN
Trich, Wet Prep: NONE SEEN
Yeast Wet Prep HPF POC: NONE SEEN

## 2016-10-22 LAB — URINALYSIS, ROUTINE W REFLEX MICROSCOPIC
BILIRUBIN URINE: NEGATIVE
Glucose, UA: NEGATIVE mg/dL
KETONES UR: NEGATIVE mg/dL
LEUKOCYTES UA: NEGATIVE
NITRITE: NEGATIVE
PH: 8 (ref 5.0–8.0)
Protein, ur: NEGATIVE mg/dL
SPECIFIC GRAVITY, URINE: 1.027 (ref 1.005–1.030)

## 2016-10-22 LAB — PREGNANCY, URINE: PREG TEST UR: NEGATIVE

## 2016-10-22 LAB — URINALYSIS, MICROSCOPIC (REFLEX)

## 2016-10-22 MED ORDER — METRONIDAZOLE 500 MG PO TABS: 500.0000 mg | ORAL_TABLET | Freq: Two times a day (BID) | ORAL | 0 refills | Status: DC

## 2016-10-22 NOTE — Discharge Instructions (Signed)
Flagyl as prescribed.  We will call you if your cultures indicate you require further treatment or action.

## 2016-10-22 NOTE — ED Triage Notes (Addendum)
Pt states that she frequently gets BV. States that she was not able to be seen by her MD upstairs, but was advised to come to the ED to be evaluated. Denies any abd pain. C/o vaginal odor and vaginal d/c times 2 weeks. No other complaints. States she is currently on her menstrual cycle.

## 2016-10-22 NOTE — ED Provider Notes (Signed)
[facility] Note   CSN: [id number] Arrival date & time: 10/22/16  0356     History   Chief Complaint Chief Complaint  Patient presents with  . vaginal d/c    HPI Ann Allen is a 31 y.o. female.  Patient is a 31 year old female with past medical history of recurrent bacterial vaginosis infections. She presents today for evaluation of vaginal discharge for the past several days. She reports that it has "an odor". She denies any new sexual contacts or exposures. She denies any fevers or chills. She denies any pain.   The history is provided by the patient.    Past Medical History:  Diagnosis Date  . Anxiety   . Bartholin cyst     There are no active problems to display for this patient.   Past Surgical History:  Procedure Laterality Date  . BREAST SURGERY     breast augmentation  . CESAREAN SECTION N/A    2 c-sections  . CHOLECYSTECTOMY      OB History    Gravida Para Term Preterm AB Living   8 2 2   5 2    SAB TAB Ectopic Multiple Live Births   1 4     2        Home Medications    Prior to Admission medications   Medication Sig Start Date End Date Taking? Authorizing Provider  loratadine (CLARITIN) 10 MG tablet Take 10 mg by mouth daily.   Yes [provider]  metroNIDAZOLE (FLAGYL) 500 MG tablet  09/07/16   [provider]    Family History No family history on file.  Social History Social History  Substance Use Topics  . Smoking status: Former Games developer  . Smokeless tobacco: Never Used  . Alcohol use Yes     Comment: occ     Allergies   Patient has no known allergies.   Review of Systems Review of Systems  All other systems reviewed and are negative.    Physical Exam Updated Vital Signs BP 121/70 (BP Location: Right Arm)   Pulse 82   Temp 98.2 F (36.8 C) (Oral)   Resp 16   Ht 5\' 1"  (1.549 m)   Wt 71.2 kg (157 lb)   LMP  (Exact Date)   SpO2 99%   BMI 29.66 kg/m   Physical Exam    Constitutional: She is oriented to person, place, and time. She appears well-developed and well-nourished. No distress.  HENT:  Head: Normocephalic and atraumatic.  Neck: Normal range of motion. Neck supple.  Cardiovascular: Normal rate and regular rhythm.  Exam reveals no gallop and no friction rub.   No murmur heard. Pulmonary/Chest: Effort normal and breath sounds normal. No respiratory distress. She has no wheezes.  Abdominal: Soft. Bowel sounds are normal. She exhibits no distension. There is no tenderness.  Genitourinary:  Genitourinary Comments: Patient is currently menstruating. There is slight discharge in the vaginal vault along with maroon blood.  Musculoskeletal: Normal range of motion.  Neurological: She is alert and oriented to person, place, and time.  Skin: Skin is warm and dry. She is not diaphoretic.  Nursing note and vitals reviewed.    ED Treatments / Results  Labs (all labs ordered are listed, but only abnormal results are displayed) Labs Reviewed  WET PREP, GENITAL  URINALYSIS, ROUTINE W REFLEX MICROSCOPIC  PREGNANCY, URINE  GC/CHLAMYDIA PROBE AMP ([place]) NOT AT [facility]    EKG  EKG Interpretation None  Radiology No results found.  Procedures Procedures (including critical care time)  Medications Ordered in ED Medications - No data to display   Initial Impression / Assessment and Plan / ED Course  I have reviewed the triage vital signs and the nursing notes.  Pertinent labs & imaging results that were available during my care of the patient were reviewed by me and considered in my medical decision making (see chart for details).  Patient presents with complaints of vaginal discharge and odor and a history of recurrent BV. Pelvic examination reveals minimal discharge, however she is on her period and this may skew the exam/wet prep results. There are no clue cells present on her wet prep, however she is convinced that she has BV. She will  be treated with Flagyl. GC and Chlamydia cultures are pending.  Final Clinical Impressions(s) / ED Diagnoses   Final diagnoses:  None    New Prescriptions New Prescriptions   No medications on file     Geoffery Lyonselo, Daleah Coulson, MD 10/22/16 [id number]

## 2016-10-22 NOTE — ED Notes (Signed)
MD with pt  

## 2016-10-23 LAB — GC/CHLAMYDIA PROBE AMP (~~LOC~~) NOT AT ARMC
CHLAMYDIA, DNA PROBE: NEGATIVE
Neisseria Gonorrhea: NEGATIVE

## 2016-11-02 ENCOUNTER — Encounter: Payer: Self-pay | Admitting: Family Medicine

## 2016-11-02 NOTE — Progress Notes (Signed)
Patient no showed her appointment today--she may re-schedule at her convenience.

## 2017-07-01 ENCOUNTER — Other Ambulatory Visit: Payer: Self-pay

## 2017-07-01 ENCOUNTER — Encounter (HOSPITAL_BASED_OUTPATIENT_CLINIC_OR_DEPARTMENT_OTHER): Payer: Self-pay | Admitting: *Deleted

## 2017-07-01 ENCOUNTER — Emergency Department (HOSPITAL_BASED_OUTPATIENT_CLINIC_OR_DEPARTMENT_OTHER)
Admission: EM | Admit: 2017-07-01 | Discharge: 2017-07-02 | Disposition: A | Discharge status: Home / Self Care | Payer: Medicaid Other | Attending: Emergency Medicine | Admitting: Emergency Medicine

## 2017-07-01 DIAGNOSIS — B373 Candidiasis of vulva and vagina: Secondary | ICD-10-CM | POA: Insufficient documentation

## 2017-07-01 DIAGNOSIS — Z87891 Personal history of nicotine dependence: Secondary | ICD-10-CM | POA: Insufficient documentation

## 2017-07-01 DIAGNOSIS — B3731 Acute candidiasis of vulva and vagina: Secondary | ICD-10-CM

## 2017-07-01 DIAGNOSIS — N898 Other specified noninflammatory disorders of vagina: Secondary | ICD-10-CM | POA: Diagnosis present

## 2017-07-01 LAB — URINALYSIS, ROUTINE W REFLEX MICROSCOPIC
BILIRUBIN URINE: NEGATIVE
Glucose, UA: NEGATIVE mg/dL
HGB URINE DIPSTICK: NEGATIVE
Ketones, ur: 15 mg/dL — AB
Leukocytes, UA: NEGATIVE
Nitrite: NEGATIVE
PROTEIN: NEGATIVE mg/dL
Specific Gravity, Urine: 1.02 (ref 1.005–1.030)
pH: 7 (ref 5.0–8.0)

## 2017-07-01 LAB — PREGNANCY, URINE: PREG TEST UR: NEGATIVE

## 2017-07-01 NOTE — ED Triage Notes (Signed)
Pt reports vaginal d/c and burning x 4 days. Itching also

## 2017-07-02 LAB — WET PREP, GENITAL
Sperm: NONE SEEN
Trich, Wet Prep: NONE SEEN
WBC, Wet Prep HPF POC: NONE SEEN

## 2017-07-02 MED ORDER — ONDANSETRON 4 MG PO TBDP
4.0000 mg | ORAL_TABLET | Freq: Once | ORAL | Status: AC
  Administered 2017-07-02: 4 mg via ORAL
  Filled 2017-07-02: qty 1

## 2017-07-02 MED ORDER — FLUCONAZOLE 100 MG PO TABS
200.0000 mg | ORAL_TABLET | Freq: Once | ORAL | Status: AC
  Administered 2017-07-02: 200 mg via ORAL
  Filled 2017-07-02: qty 2

## 2017-07-02 MED ORDER — METRONIDAZOLE 500 MG PO TABS
2000.0000 mg | ORAL_TABLET | Freq: Once | ORAL | Status: AC
  Administered 2017-07-02: 2000 mg via ORAL
  Filled 2017-07-02: qty 4

## 2017-07-02 NOTE — ED Provider Notes (Signed)
[facility] EMERGENCY DEPARTMENT Provider Note   CSN: [id number] Arrival date & time: 07/01/17  2100     History   Chief Complaint Chief Complaint  Patient presents with  . Vaginal Discharge    HPI Ann Allen is a 32 y.o. female.  The history is provided by the patient.  Vaginal Discharge   The current episode started more than 2 days ago. The problem occurs daily. The problem has been gradually worsening. Associated symptoms include genital itching. Pertinent negatives include no fever, no abdominal pain, no dysuria and no genital lesions. She has tried nothing for the symptoms.    Past Medical History:  Diagnosis Date  . Anxiety   . Bartholin cyst     There are no active problems to display for this patient.   Past Surgical History:  Procedure Laterality Date  . BREAST SURGERY     breast augmentation  . CESAREAN SECTION N/A    2 c-sections  . CHOLECYSTECTOMY      OB History    Gravida Para Term Preterm AB Living   8 2 2   5 2    SAB TAB Ectopic Multiple Live Births   1 4     2        Home Medications    Prior to Admission medications   Medication Sig Start Date End Date Taking? Authorizing Provider  loratadine (CLARITIN) 10 MG tablet Take 10 mg by mouth daily.    [provider]  metroNIDAZOLE (FLAGYL) 500 MG tablet Take 1 tablet (500 mg total) by mouth 2 (two) times daily. One po bid x 7 days 10/22/16   Geoffery Lyonselo, Douglas, MD    Family History No family history on file.  Social History Social History   Tobacco Use  . Smoking status: Former Games developermoker  . Smokeless tobacco: Never Used  Substance Use Topics  . Alcohol use: Yes    Comment: occ  . Drug use: Yes    Frequency: 7.0 times per week    Types: Marijuana    Comment: daily use of marijuana     Allergies   Patient has no known allergies.   Review of Systems Review of Systems  Constitutional: Negative for fever.  Gastrointestinal: Negative for abdominal pain.    Genitourinary: Positive for vaginal discharge. Negative for dysuria.  All other systems reviewed and are negative.    Physical Exam Updated Vital Signs BP 107/61 (BP Location: Left Arm)   Pulse 60   Temp 98.5 F (36.9 C) (Oral)   Resp 18   Ht 1.549 m (5\' 1" )   Wt 73.9 kg (163 lb)   LMP 06/14/2017 (Approximate)   SpO2 100%   Breastfeeding? No   BMI 30.80 kg/m   Physical Exam CONSTITUTIONAL: Well developed/well nourished HEAD: Normocephalic/atraumatic EYES: EOMI/PERRL ENMT: Mucous membranes moist NECK: supple no meningeal signs SPINE/BACK:entire spine nontender CV: S1/S2 noted, no murmurs/rubs/gallops noted LUNGS: Lungs are clear to auscultation bilaterally, no apparent distress ABDOMEN: soft, nontender, no rebound or guarding, bowel sounds noted throughout abdomen GU:no cva tenderness,whitish discharge, no cmt, no bleeding, no external/herpetic lesions noted.  Nurse chaperone present for exam NEURO: Pt is awake/alert/appropriate, moves all extremitiesx4.  No facial droop.   EXTREMITIES: pulses normal/equal, full ROM SKIN: warm, color normal PSYCH: no abnormalities of mood noted, alert and oriented to situation   ED Treatments / Results  Labs (all labs ordered are listed, but only abnormal results are displayed) Labs Reviewed  WET PREP, GENITAL - Abnormal; Notable for  the following components:      Result Value   Yeast Wet Prep HPF POC PRESENT (*)    Clue Cells Wet Prep HPF POC PRESENT (*)    All other components within normal limits  URINALYSIS, ROUTINE W REFLEX MICROSCOPIC - Abnormal; Notable for the following components:   Ketones, ur 15 (*)    All other components within normal limits  PREGNANCY, URINE  GC/CHLAMYDIA PROBE AMP ([place]) NOT AT [facility]    EKG  EKG Interpretation None       Radiology No results found.  Procedures Procedures (including critical care time)  Medications Ordered in ED Medications  fluconazole (DIFLUCAN) tablet 200 mg  (200 mg Oral Given 07/02/17 0324)  ondansetron (ZOFRAN-ODT) disintegrating tablet 4 mg (4 mg Oral Given 07/02/17 0325)  metroNIDAZOLE (FLAGYL) tablet 2,000 mg (2,000 mg Oral Given 07/02/17 0323)     Initial Impression / Assessment and Plan / ED Course  I have reviewed the triage vital signs and the nursing notes.  Pertinent labs   results that were available during my care of the patient were reviewed by me and considered in my medical decision making (see chart for details).     Pt found to have yeast infection She requests flagyl as she think she has BV One time dose of flagyl given   Final Clinical Impressions(s) / ED Diagnoses   Final diagnoses:  Yeast vaginitis    ED Discharge Orders    None       Zadie Rhine, MD 07/02/17 [phone]

## 2017-07-02 NOTE — ED Notes (Signed)
Pt given d/c instructions as per chart. Verbalizes understanding. No questions. 

## 2017-07-03 LAB — GC/CHLAMYDIA PROBE AMP (~~LOC~~) NOT AT ARMC
Chlamydia: NEGATIVE
Neisseria Gonorrhea: NEGATIVE

## 2017-07-05 ENCOUNTER — Emergency Department (HOSPITAL_BASED_OUTPATIENT_CLINIC_OR_DEPARTMENT_OTHER): Admission: EM | Admit: 2017-07-05 | Discharge: 2017-07-05 | Discharge status: Against Medical Advice | Payer: Self-pay

## 2017-07-06 ENCOUNTER — Other Ambulatory Visit: Payer: Self-pay

## 2017-07-06 ENCOUNTER — Emergency Department (HOSPITAL_BASED_OUTPATIENT_CLINIC_OR_DEPARTMENT_OTHER)
Admission: EM | Admit: 2017-07-06 | Discharge: 2017-07-06 | Disposition: A | Discharge status: Home / Self Care | Payer: Medicaid Other | Attending: Emergency Medicine | Admitting: Emergency Medicine

## 2017-07-06 ENCOUNTER — Encounter (HOSPITAL_BASED_OUTPATIENT_CLINIC_OR_DEPARTMENT_OTHER): Payer: Self-pay | Admitting: Emergency Medicine

## 2017-07-06 DIAGNOSIS — N760 Acute vaginitis: Secondary | ICD-10-CM | POA: Diagnosis not present

## 2017-07-06 DIAGNOSIS — Z79899 Other long term (current) drug therapy: Secondary | ICD-10-CM | POA: Diagnosis not present

## 2017-07-06 DIAGNOSIS — Z87891 Personal history of nicotine dependence: Secondary | ICD-10-CM | POA: Diagnosis not present

## 2017-07-06 DIAGNOSIS — B379 Candidiasis, unspecified: Secondary | ICD-10-CM | POA: Insufficient documentation

## 2017-07-06 DIAGNOSIS — B9689 Other specified bacterial agents as the cause of diseases classified elsewhere: Secondary | ICD-10-CM | POA: Insufficient documentation

## 2017-07-06 DIAGNOSIS — N898 Other specified noninflammatory disorders of vagina: Secondary | ICD-10-CM | POA: Diagnosis present

## 2017-07-06 MED ORDER — METRONIDAZOLE 500 MG PO TABS: 500.0000 mg | ORAL_TABLET | Freq: Two times a day (BID) | ORAL | 0 refills | Status: AC

## 2017-07-06 MED ORDER — FLUCONAZOLE 150 MG PO TABS: 150.0000 mg | ORAL_TABLET | Freq: Once | ORAL | 0 refills | Status: AC

## 2017-07-06 MED ORDER — FLUCONAZOLE 100 MG PO TABS
200.0000 mg | ORAL_TABLET | Freq: Once | ORAL | Status: AC
  Administered 2017-07-06: 200 mg via ORAL
  Filled 2017-07-06: qty 2

## 2017-07-06 NOTE — ED Triage Notes (Signed)
Pt sts she was treated for yeast and BV here 5 days ago.  Sts the Flagyl has given her "another yeast infection."  Having itching again.

## 2017-07-06 NOTE — ED Provider Notes (Signed)
[facility] EMERGENCY DEPARTMENT Provider Note   CSN: [id number] Arrival date & time: 07/06/17  0808     History   Chief Complaint Chief Complaint  Patient presents with  . Vaginal Itching    HPI Ann Allen is a 32 y.o. female.  HPI   32 year old female presents with concern for vaginal itching.  Reports that she had been seen 5 days ago with concern for vaginal discharge and had been treated for bacterial vaginosis and yeast infection.  She was given a one-time dose of Flagyl, and given a one-time dose for yeast infection.  Reports she felt better the next day, with mild improvement for 2 days, but over the last 2 days she has had return of symptoms.  Reports she is has severe pruritus.  She also began to have her menses.  She reports she is very uncomfortable.  Denies any other symptoms of dysuria.  Denies abdominal pain, nausea or vomiting.  Had recent negative pregnancy test, as well as negative gonorrhea committee of screening, and she does not require further testing.  Past Medical History:  Diagnosis Date  . Anxiety   . Bartholin cyst     There are no active problems to display for this patient.   Past Surgical History:  Procedure Laterality Date  . BREAST SURGERY     breast augmentation  . CESAREAN SECTION N/A    2 c-sections  . CHOLECYSTECTOMY      OB History    Gravida Para Term Preterm AB Living   8 2 2   5 2    SAB TAB Ectopic Multiple Live Births   1 4     2        Home Medications    Prior to Admission medications   Medication Sig Start Date End Date Taking? Authorizing Provider  fluconazole (DIFLUCAN) 150 MG tablet Take 1 tablet (150 mg total) by mouth once for 1 dose. Every 72 hours if still symptomatic 07/06/17 07/06/17  Alvira MondaySchlossman, Denzil Bristol, MD  loratadine (CLARITIN) 10 MG tablet Take 10 mg by mouth daily.    [provider]  metroNIDAZOLE (FLAGYL) 500 MG tablet Take 1 tablet (500 mg total) by mouth 2 (two) times daily. One po  bid x 7 days 07/06/17   Alvira MondaySchlossman, Dakari Stabler, MD    Family History No family history on file.  Social History Social History   Tobacco Use  . Smoking status: Former Games developermoker  . Smokeless tobacco: Never Used  Substance Use Topics  . Alcohol use: Yes    Comment: occ  . Drug use: Yes    Frequency: 7.0 times per week    Types: Marijuana    Comment: daily use of marijuana     Allergies   Patient has no known allergies.   Review of Systems Review of Systems  Constitutional: Negative for fever.  Gastrointestinal: Negative for abdominal pain, nausea and vomiting.  Genitourinary: Positive for vaginal bleeding and vaginal discharge. Negative for dysuria.     Physical Exam Updated Vital Signs BP 113/68 (BP Location: Right Arm)   Pulse 69   Temp 98.2 F (36.8 C) (Oral)   Resp 16   Ht 5\' 1"  (1.549 m)   Wt 73.9 kg (163 lb)   LMP 06/14/2017 (Approximate)   SpO2 99%   BMI 30.80 kg/m   Physical Exam  Constitutional: She is oriented to person, place, and time. She appears well-developed and well-nourished. No distress.  HENT:  Head: Normocephalic and atraumatic.  Eyes: Conjunctivae  and EOM are normal.  Neck: Normal range of motion.  Cardiovascular: Normal rate.  Pulmonary/Chest: Effort normal. No respiratory distress.  Abdominal: Soft. She exhibits no distension. There is no tenderness. There is no guarding.  Neurological: She is alert and oriented to person, place, and time.  Skin: Skin is warm and dry. No rash noted. She is not diaphoretic. No erythema.  Nursing note and vitals reviewed.    ED Treatments / Results  Labs (all labs ordered are listed, but only abnormal results are displayed) Labs Reviewed - No data to display  EKG  EKG Interpretation None       Radiology No results found.  Procedures Procedures (including critical care time)  Medications Ordered in ED Medications  fluconazole (DIFLUCAN) tablet 200 mg (not administered)     Initial  Impression / Assessment and Plan / ED Course  I have reviewed the triage vital signs and the nursing notes.  Pertinent labs & imaging results that were available during my care of the patient were reviewed by me and considered in my medical decision making (see chart for details).     32 year old female presents with concern for vaginal itching.  Reports that she had been seen 5 days ago with concern for vaginal discharge and had been treated for bacterial vaginosis and yeast infection.  Given recent pelvic exam with negative GC/chlamydia, pos BV/candida, do not feel repeat exam indicated.  Patient had received one-time dose of Flagyl for BV previously, however with return of symptoms feel 7 days of treatment is indicated, and gave patient prescription for 7 days of Flagyl.  Also gave her a prescription for fluconazole to take every 72 hours as needed if she has continuing symptoms of itching.  Gave her a dose in the emergency department.  Recommend follow-up with her primary care physician.  Patient discharged in stable condition with understanding of reasons to return.   Final Clinical Impressions(s) / ED Diagnoses   Final diagnoses:  Bacterial vaginosis  Yeast infection    ED Discharge Orders        Ordered    metroNIDAZOLE (FLAGYL) 500 MG tablet  2 times daily     07/06/17 0836    fluconazole (DIFLUCAN) 150 MG tablet   Once     07/06/17 4098       Alvira Monday, MD 07/06/17 0930

## 2017-07-13 MED FILL — FLUCONAZOLE 150 MG TABLET: 150 | 2 days supply | Qty: 2 | Fill #0

## 2017-07-13 MED FILL — metroNIDAZOLE 500 MG TABS: 500 | 7 days supply | Qty: 14 | Fill #0

## 2018-09-11 IMAGING — US US OB COMP LESS 14 WK
1 series · 15 of 28 positions shown · non-contrast
Comparison: None.

CLINICAL DATA: Acute onset of vaginal bleeding.  Initial encounter.

EXAM:
OBSTETRIC <14 WK US AND TRANSVAGINAL OB US
TECHNIQUE: Both transabdominal and transvaginal ultrasound examinations were
performed for complete evaluation of the gestation as well as the
maternal uterus, adnexal regions, and pelvic cul-de-sac.
Transvaginal technique was performed to assess early pregnancy.

[Series 1: us ob comp less 14 wk · 15 of 98 slices shown]
[im 1/98]
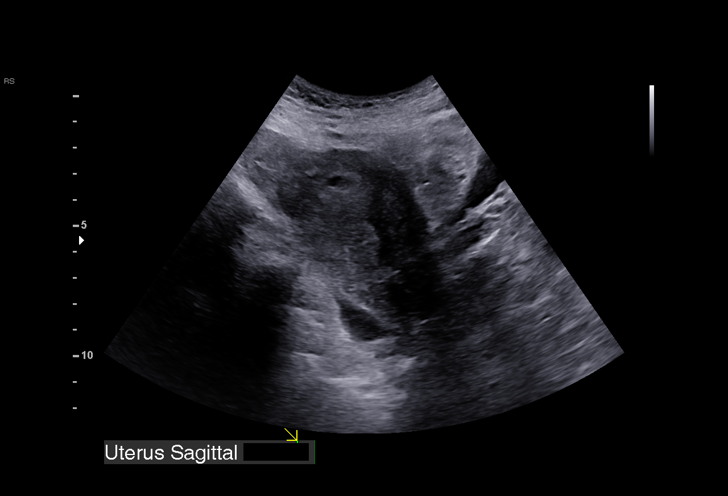
[im 8/98]
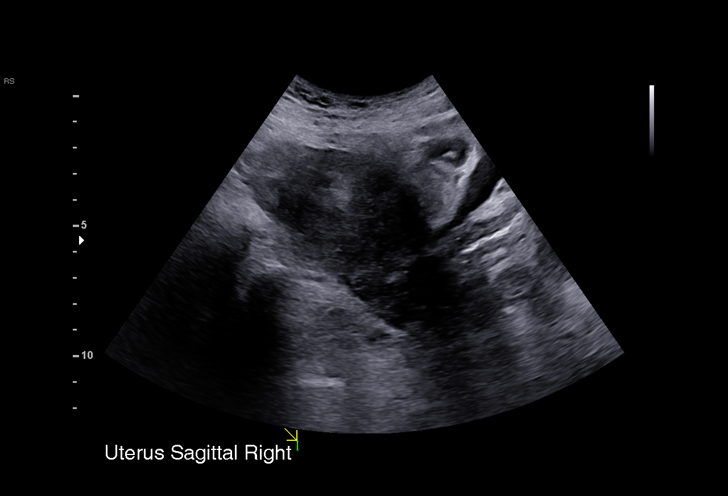
[im 15/98]
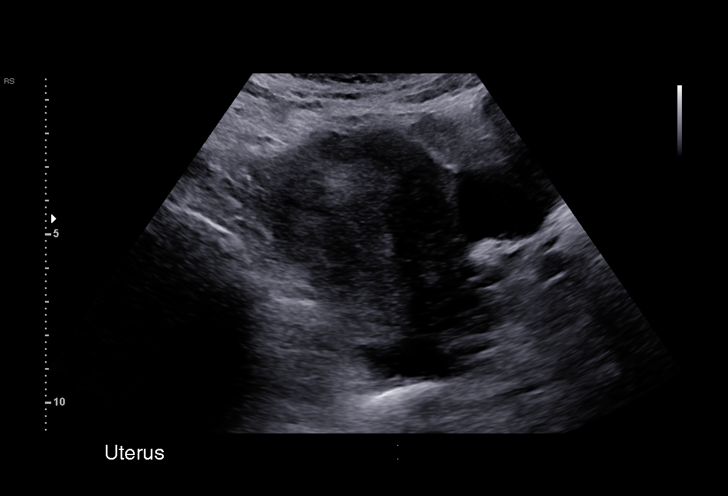
[im 22/98]
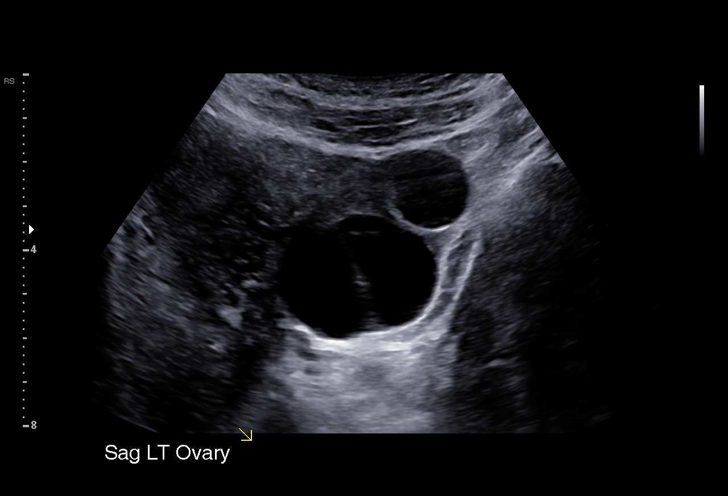
[im 29/98]
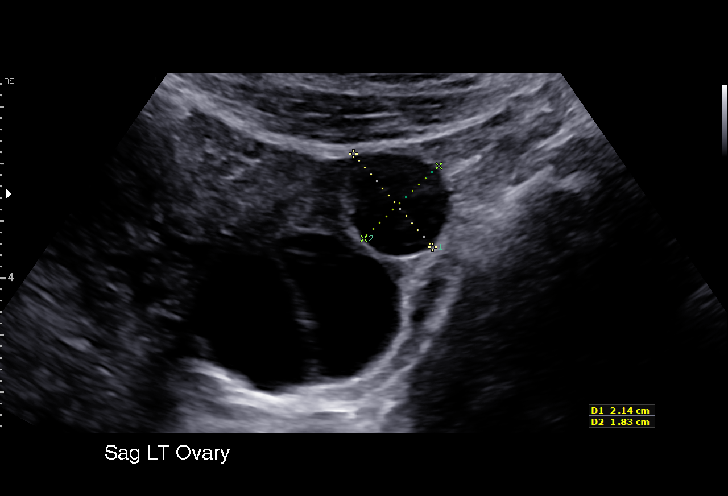
[im 36/98]
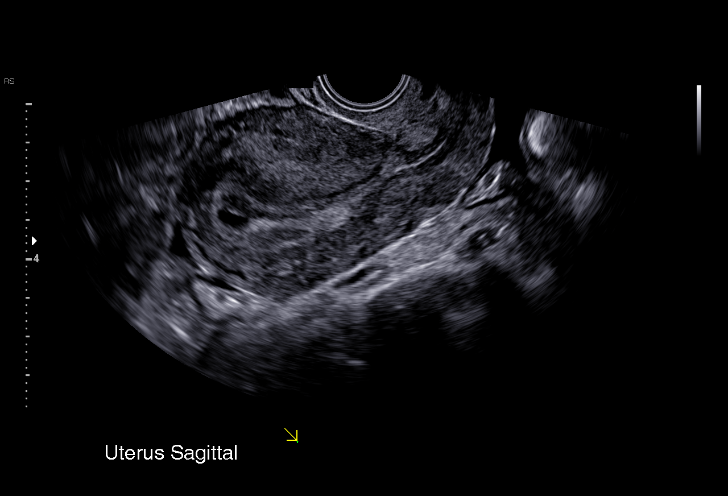
[im 44/98]
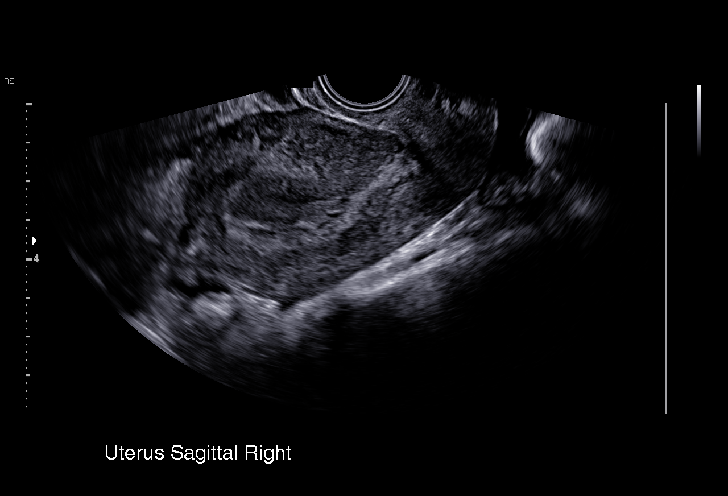
[im 51/98]
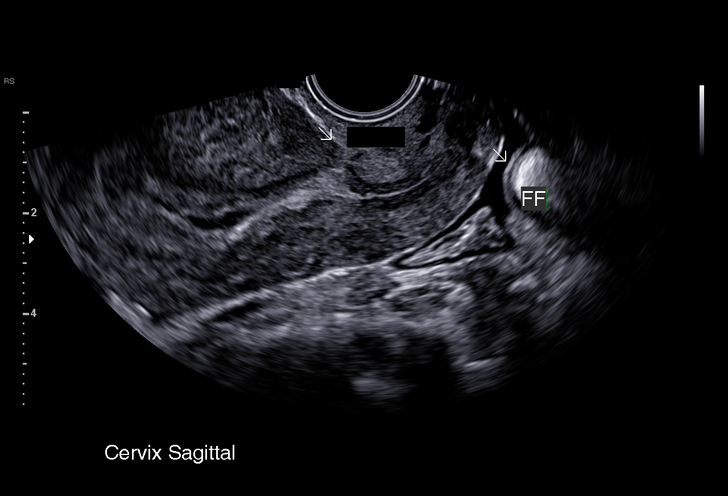
[im 54/98]
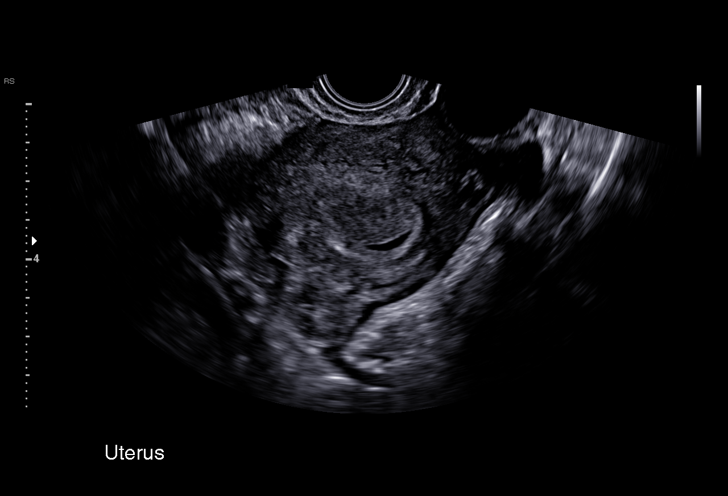
[im 62/98]
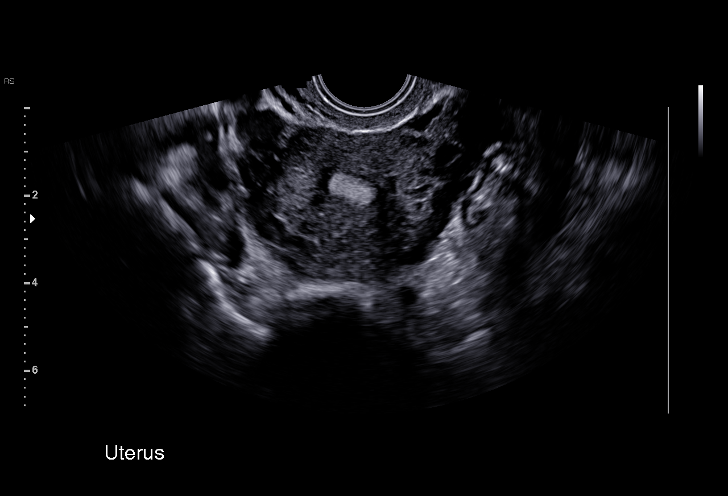
[im 69/98]
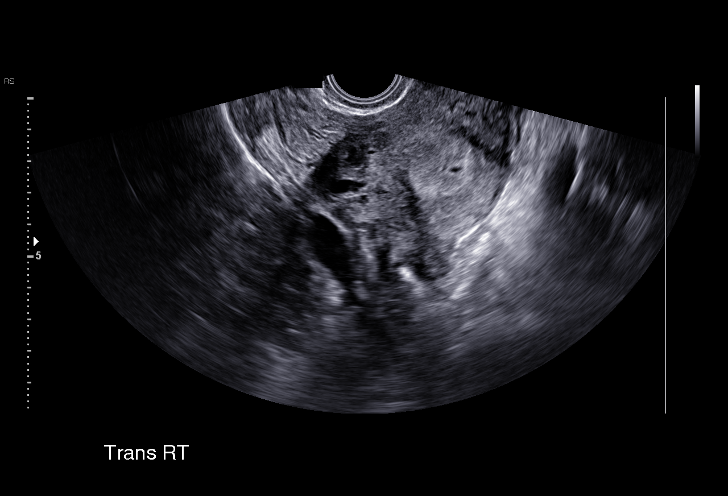
[im 76/98]
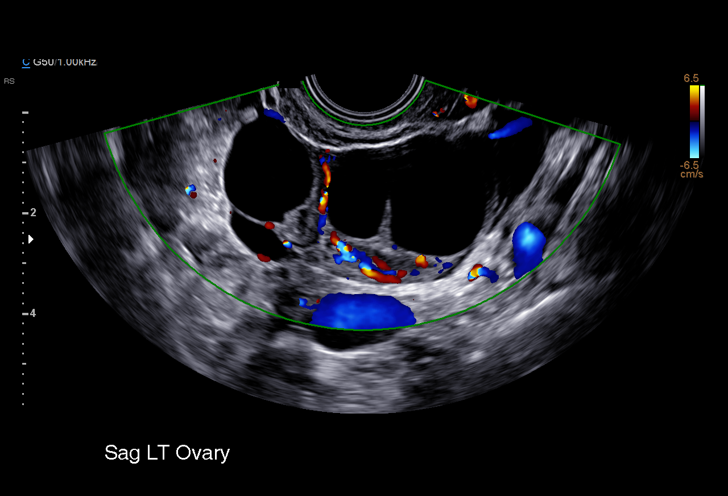
[im 83/98]
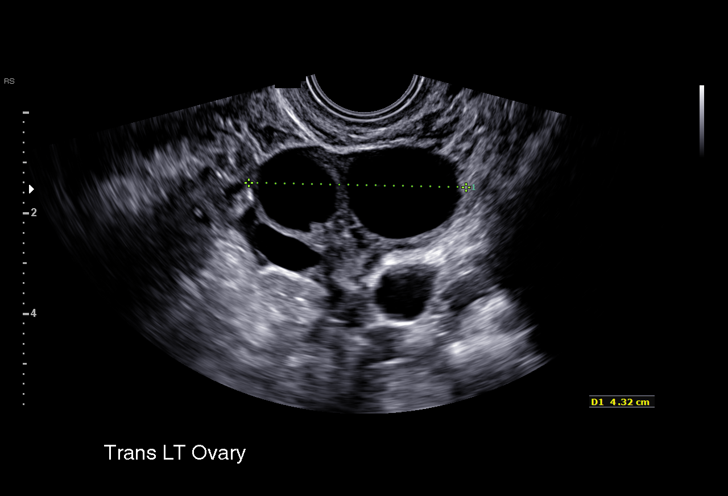
[im 90/98]
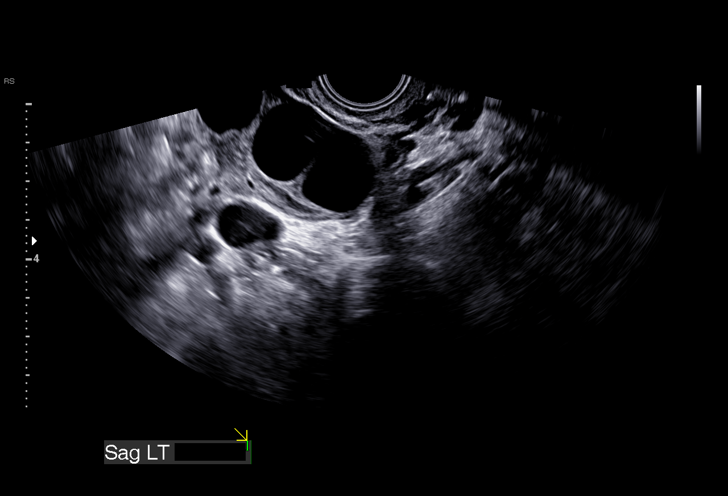
[im 98/98]
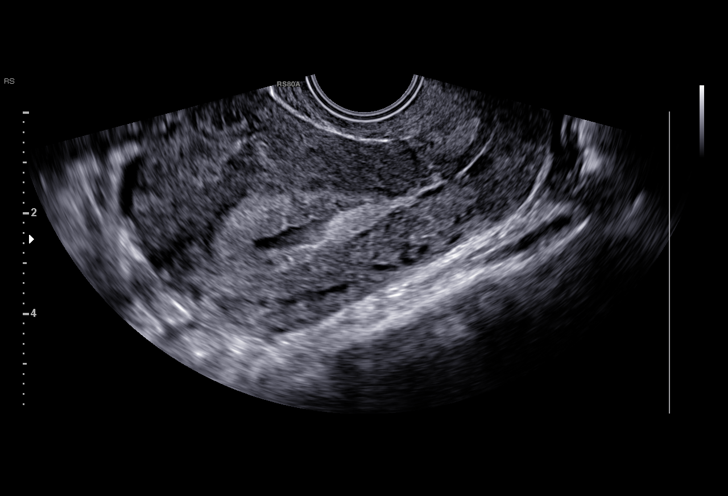

[15 of 28 positions shown; findings below may reference images not displayed]

FINDINGS: Intrauterine gestational sac: Single; somewhat flattened, with mild
internal echogenicity.

Yolk sac:  No

Embryo:  No

Cardiac Activity: N/A

MSD: 1.03 cm   5 w   5  d

Subchorionic hemorrhage:  None visualized.

Maternal uterus/adnexae: The uterus otherwise unremarkable in
appearance.

The right ovary is unremarkable. The right ovary measures 3.0 x
x 1.8 cm, while the left ovary measures 5.5 x 3.0 x 4.3 cm. A 3.5 cm
left ovarian cyst is noted.

A small amount of free fluid is seen within the pelvic cul-de-sac.
IMPRESSION: Single somewhat flattened intrauterine gestational sac noted, with a
mean sac diameter of 1.0 cm. Mild internal echogenicity noted. No
yolk sac or embryo seen at this time. If the patient's quantitative
beta HCG continues to rise, follow-up pelvic ultrasound could be
performed in 2 weeks for further evaluation.

The mean sac diameter corresponds to a gestational age of 5 weeks 5
days, which matches the gestational age of 5 weeks 4 days by LMP,
reflecting an estimated date of delivery April 22, 2017.
